# Patient Record
Sex: Female | Born: 1965 | Race: Black or African American | Hispanic: No | Marital: Married | Smoking: Former smoker
Health system: Southern US, Community
[De-identification: ages and names within clinical notes are randomized; demographics above are authoritative.]

## PROBLEM LIST (undated history)

## (undated) DIAGNOSIS — G4730 Sleep apnea, unspecified: Secondary | ICD-10-CM

## (undated) DIAGNOSIS — I10 Essential (primary) hypertension: Secondary | ICD-10-CM

---

## 1991-10-03 HISTORY — PX: TUBAL LIGATION: SHX77

## 1996-10-02 HISTORY — PX: ABDOMINAL HYSTERECTOMY: SHX81

## 2003-10-03 HISTORY — PX: BUNIONECTOMY: SHX129

## 2004-11-25 ENCOUNTER — Ambulatory Visit: Payer: Self-pay | Admitting: Podiatry

## 2005-01-20 ENCOUNTER — Ambulatory Visit: Payer: Self-pay | Admitting: Podiatry

## 2005-02-14 ENCOUNTER — Ambulatory Visit: Payer: Self-pay

## 2005-02-22 ENCOUNTER — Ambulatory Visit: Payer: Self-pay | Admitting: Podiatry

## 2008-10-19 DIAGNOSIS — G4730 Sleep apnea, unspecified: Secondary | ICD-10-CM | POA: Insufficient documentation

## 2008-10-19 DIAGNOSIS — Z720 Tobacco use: Secondary | ICD-10-CM | POA: Insufficient documentation

## 2008-10-20 ENCOUNTER — Ambulatory Visit: Payer: Self-pay

## 2008-10-23 LAB — HM PAP SMEAR

## 2009-02-27 DIAGNOSIS — L679 Hair color and hair shaft abnormality, unspecified: Secondary | ICD-10-CM | POA: Insufficient documentation

## 2010-03-29 DIAGNOSIS — Z833 Family history of diabetes mellitus: Secondary | ICD-10-CM | POA: Insufficient documentation

## 2010-03-29 DIAGNOSIS — R002 Palpitations: Secondary | ICD-10-CM | POA: Insufficient documentation

## 2010-10-08 ENCOUNTER — Emergency Department: Payer: Self-pay | Admitting: Emergency Medicine

## 2011-06-09 ENCOUNTER — Ambulatory Visit: Payer: Self-pay | Admitting: Family Medicine

## 2012-10-10 ENCOUNTER — Ambulatory Visit: Payer: Self-pay | Admitting: Family Medicine

## 2012-10-13 ENCOUNTER — Emergency Department: Payer: Self-pay | Admitting: Emergency Medicine

## 2012-10-13 LAB — RAPID INFLUENZA A&B ANTIGENS

## 2012-10-14 ENCOUNTER — Ambulatory Visit: Payer: Self-pay | Admitting: Family Medicine

## 2012-11-08 ENCOUNTER — Ambulatory Visit: Payer: Self-pay | Admitting: Family Medicine

## 2013-11-10 ENCOUNTER — Ambulatory Visit: Payer: Self-pay | Admitting: Family Medicine

## 2013-11-12 ENCOUNTER — Ambulatory Visit: Payer: Self-pay

## 2013-11-21 ENCOUNTER — Ambulatory Visit: Payer: Self-pay

## 2014-11-14 IMAGING — MG MM ADDITIONAL VIEWS AT NO CHARGE
2 series · 5 of 5 positions shown · non-contrast
Comparison: Priors dating back to 01/14/2004.

CLINICAL DATA: Patient recalled from screening for possible left
breast mass.

EXAM:
DIGITAL DIAGNOSTIC  LEFT MAMMOGRAM WITH CAD
ULTRASOUND LEFT BREAST

[L CC · left · 2 of 2 slices shown]
[im 1/2]
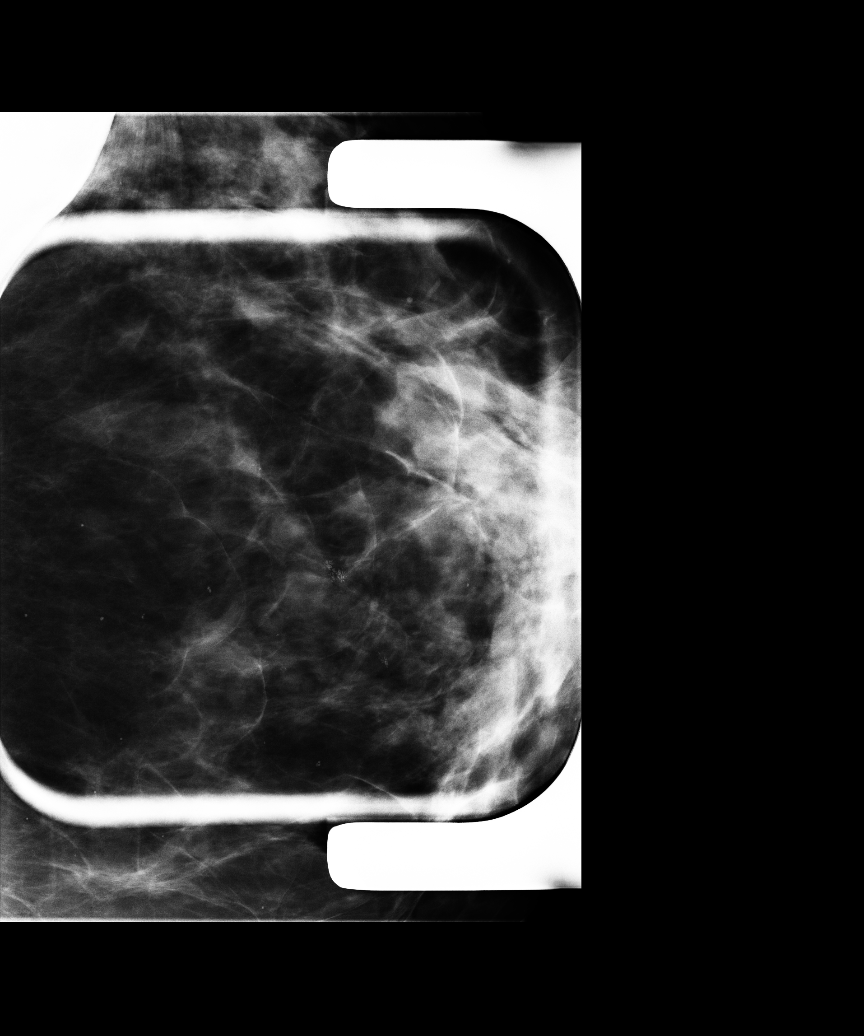
[im 2/2]
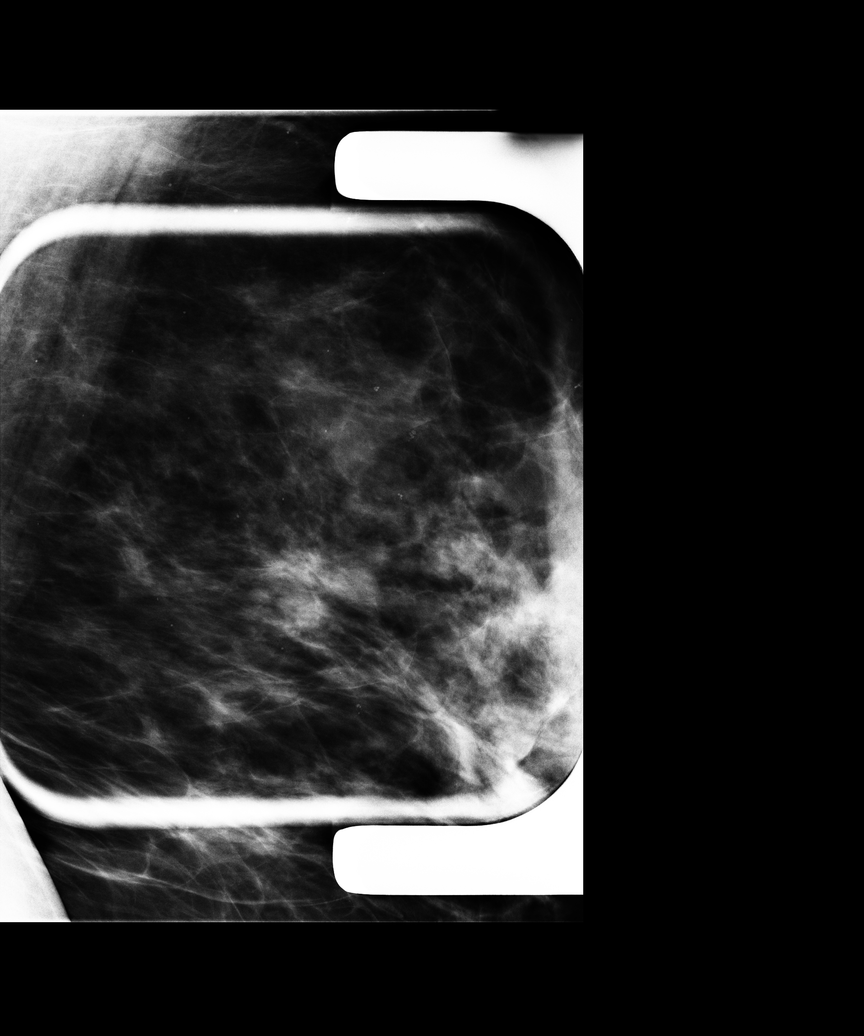

[L MLO · left · 3 of 3 slices shown]
[im 1/3]
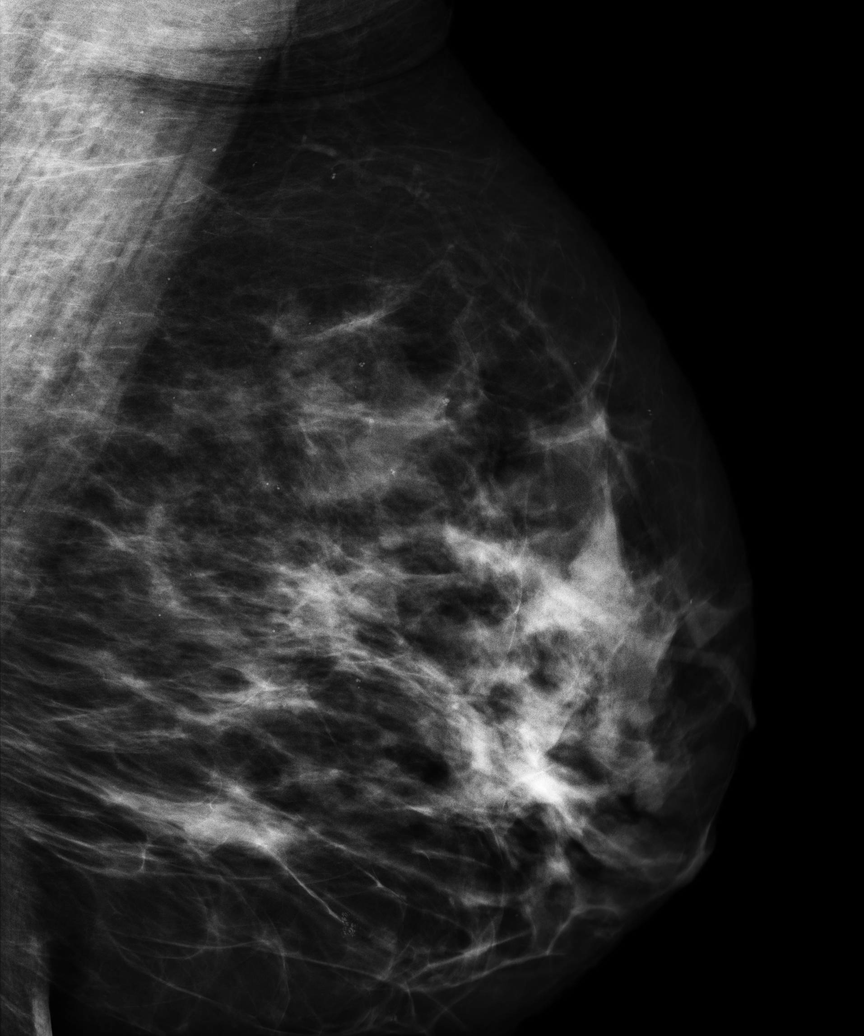
[im 2/3]
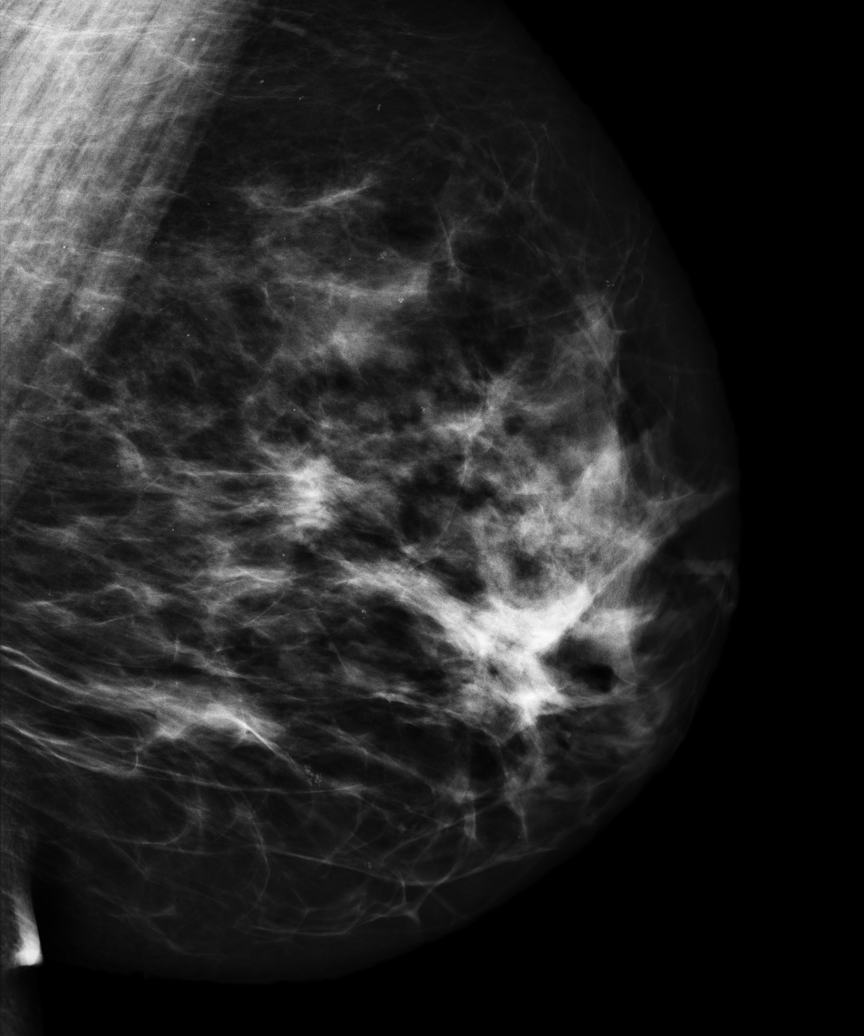
[im 3/3]
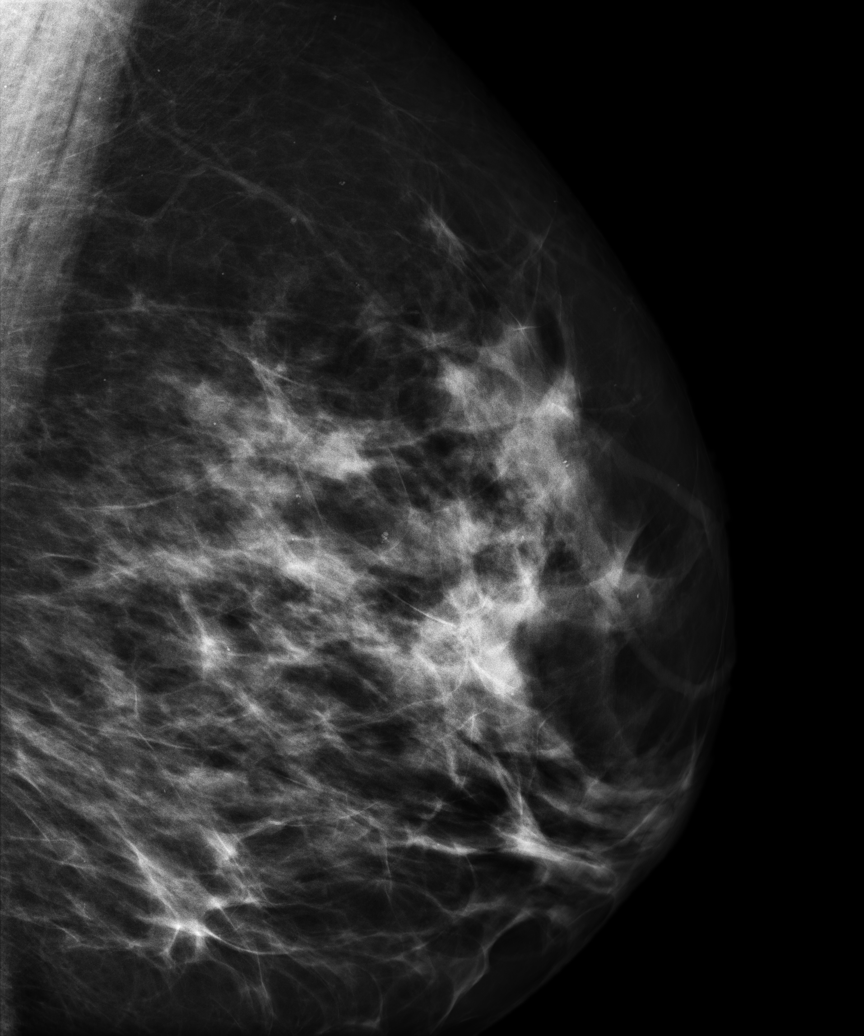

[5 of 5 positions shown; findings below may reference images not displayed]

ACR Breast Density Category c: The breast tissue is heterogeneously
dense, which may obscure small masses.
FINDINGS: Questioned mass within the 12 o'clock position left breast posterior
depth appears to resolve with additional imaging including spot
compression CC and MLO views as well as repeat full paddle MLO and
true lateral views of the left breast. The fibroglandular breast
pattern appears stable dating back to 10/20/2008.

Mammographic images were processed with CAD.

On physical exam, I palpate no discrete mass within the 12 o'clock
position left breast

Ultrasound is performed, showing no concerning mass within the 12
o'clock position left breast
IMPRESSION: No mammographic evidence for malignancy

RECOMMENDATION:
Screening mammogram in one year.(Code:[id number])

I have discussed the findings and recommendations with the patient.
Results were also provided in writing at the conclusion of the
visit. If applicable, a reminder letter will be sent to the patient
regarding the next appointment.

BI-RADS CATEGORY  1: Negative.

## 2015-04-17 ENCOUNTER — Other Ambulatory Visit: Payer: Self-pay | Admitting: Family Medicine

## 2015-04-27 ENCOUNTER — Ambulatory Visit
Admission: RE | Admit: 2015-04-27 | Discharge: 2015-04-27 | Disposition: A | Discharge status: Home / Self Care | Payer: BLUE CROSS/BLUE SHIELD | Source: Ambulatory Visit | Attending: Family Medicine | Admitting: Family Medicine

## 2015-04-27 ENCOUNTER — Other Ambulatory Visit: Payer: Self-pay | Admitting: Family Medicine

## 2015-04-27 DIAGNOSIS — Z1231 Encounter for screening mammogram for malignant neoplasm of breast: Secondary | ICD-10-CM

## 2015-04-28 ENCOUNTER — Telehealth: Payer: Self-pay

## 2015-04-28 NOTE — Telephone Encounter (Signed)
Patient advised as directed below. 

## 2015-04-28 NOTE — Telephone Encounter (Signed)
-----   Message from Margo Common, [place] sent at 04/27/2015  5:10 PM EDT ----- Normal mammograms. No sign of mass or cancer. Recheck test in a year.

## 2015-05-11 DIAGNOSIS — G4733 Obstructive sleep apnea (adult) (pediatric): Secondary | ICD-10-CM | POA: Insufficient documentation

## 2015-05-11 DIAGNOSIS — K1230 Oral mucositis (ulcerative), unspecified: Secondary | ICD-10-CM | POA: Insufficient documentation

## 2015-05-11 DIAGNOSIS — K5900 Constipation, unspecified: Secondary | ICD-10-CM | POA: Insufficient documentation

## 2015-05-11 DIAGNOSIS — M545 Low back pain, unspecified: Secondary | ICD-10-CM | POA: Insufficient documentation

## 2015-05-14 ENCOUNTER — Encounter: Payer: Self-pay | Admitting: Family Medicine

## 2015-05-14 ENCOUNTER — Ambulatory Visit (INDEPENDENT_AMBULATORY_CARE_PROVIDER_SITE_OTHER): Payer: BLUE CROSS/BLUE SHIELD | Admitting: Family Medicine

## 2015-05-14 VITALS — BP 138/66 | HR 78 | Temp 98.2°F | Resp 18 | Wt 253.0 lb

## 2015-05-14 DIAGNOSIS — M79672 Pain in left foot: Secondary | ICD-10-CM | POA: Diagnosis not present

## 2015-05-14 DIAGNOSIS — M25561 Pain in right knee: Secondary | ICD-10-CM | POA: Diagnosis not present

## 2015-05-14 DIAGNOSIS — J0111 Acute recurrent frontal sinusitis: Secondary | ICD-10-CM

## 2015-05-14 MED ORDER — AMOXICILLIN 875 MG PO TABS: 875.0000 mg | ORAL_TABLET | Freq: Two times a day (BID) | ORAL | Status: DC

## 2015-05-14 NOTE — Progress Notes (Signed)
Patient: Rhonda Williamson Female    DOB: 06-21-1966   49 y.o.   MRN: [id number] Visit Date: 05/14/2015  Today's Provider: Vernie Murders, PA   Chief Complaint  Patient presents with  . Knee Pain    Right  . Foot Pain    Left  . Sinusitis   Subjective:    Knee Pain  The incident occurred more than 1 week ago. There was no injury mechanism. The pain is present in the right knee. The quality of the pain is described as aching and burning. The pain is at a severity of 5/10 ((couple a days ago it was a 10 per pt)). The pain is moderate. The pain has been constant (When it starts) since onset. Pertinent negatives include no numbness or tingling. It is unknown if a foreign body is present. The symptoms are aggravated by movement and weight bearing. Treatments tried: Aleve; tried cold compress. The treatment provided mild relief.  Sinusitis This is a recurrent problem. The current episode started in the past 7 days. The problem is unchanged. There has been no fever (Had one on Sunday night ). The fever has been present for 1 to 2 days. Her pain is at a severity of 8/10 (when it starts draining). The pain is severe. Associated symptoms include congestion, coughing (every now and then), shortness of breath, sinus pressure, sneezing and a sore throat (because of the drainage in the back of the throat). Pertinent negatives include no ear pain or hoarse voice. Past treatments include spray decongestants. The treatment provided no relief.   Foot Pain:  Patient has Left Foot Pain on off, has tried Aleve for pain relief. Pain Location is side of the bottom of left foot. Pain quality is aching, burning, shooting,and stabbing. Pain severity is moderate to severe.  Pain numeric is about 4-5 right now, but when it flares is a 10. Pain course is is constant but when is aggravated the pain is worsening. Aggravated by movement and standing.Treatment tried massage and elevation. Associated with foot surgery  for neuromas/cysts 10-15 years ago.    Past Surgical History  Procedure Laterality Date  . Bunionectomy  2005  . Abdominal hysterectomy  1998  . Cesarean section  1993  . Tubal ligation  1993   History reviewed. No pertinent past medical history. Patient Active Problem List   Diagnosis Date Noted  . CN (constipation) 05/11/2015  . LBP (low back pain) 05/11/2015  . Obstructive apnea 05/11/2015  . Mucositis oral 05/11/2015  . Family history of diabetes mellitus 03/29/2010  . Awareness of heartbeats 03/29/2010  . 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 Disease of hair and hair follicles 24/23/5361  . Tobacco use 10/19/2008  . Apnea, sleep 10/19/2008  . Essential (primary) hypertension 12/28/2003   Family History  Problem Relation Age of Onset  . Ovarian cancer Mother   . Lung cancer Father   . Lung cancer Sister   . Bone cancer Sister   . CVA Sister   . Emphysema Brother   . Hypertension Sister     No Known Allergies   Previous Medications   AMLODIPINE (NORVASC) 10 MG TABLET    Take 1 tablet by mouth daily.   BENAZEPRIL (LOTENSIN) 20 MG TABLET    Take 1 tablet by mouth daily.   DICLOFENAC SODIUM (VOLTAREN) 1 % GEL    Place 2 g onto the skin. 3-4 TIMES DAILY PRN   FLUTICASONE (FLONASE) 50 MCG/ACT NASAL SPRAY  2 (TWO) SPRAYS IN EACH NOSTRIL AT BEDTIME   IBUPROFEN (ADVIL,MOTRIN) 800 MG TABLET    Take 1 tablet by mouth as needed.   KETOCONAZOLE (NIZORAL) 2 % SHAMPOO        Review of Systems  Constitutional: Negative.  Negative for fever.  HENT: Positive for congestion, nosebleeds, postnasal drip, rhinorrhea (a little), sinus pressure, sneezing and sore throat (because of the drainage in the back of the throat). Negative for ear pain and hoarse voice.   Eyes: Positive for itching.  Respiratory: Positive for cough (every now and then) and shortness of breath.   Cardiovascular: Negative.  Negative for chest pain and palpitations.  Gastrointestinal: Positive for constipation (a little).   Endocrine: Negative.   Genitourinary: Negative.   Musculoskeletal: Negative.   Skin: Negative.   Allergic/Immunologic: Negative.   Neurological: Negative.  Negative for tingling and numbness.  Hematological: Negative.   Psychiatric/Behavioral: Negative.     Social History  Substance Use Topics  . Smoking status: Former Research scientist (life sciences)  . Smokeless tobacco: Not on file     Comment: QUIT IN 2004  . Alcohol Use: No   Objective:   BP 138/66 mmHg  Pulse 78  Temp(Src) 98.2 F (36.8 C) (Oral)  Resp 18  Wt 253 lb (114.76 kg)  Physical Exam  Constitutional: She is oriented to person, place, and time. She appears well-developed and well-nourished.  HENT:  Head: Normocephalic and atraumatic.  Right Ear: External ear normal.  Left Ear: External ear normal.  Tender and poor transillumination of maxillary sinuses. Red turbinates and swollen.  Eyes: EOM are normal. Pupils are equal, round, and reactive to light.  Neck: Normal range of motion. Neck supple.  Cardiovascular: Normal rate, regular rhythm and normal heart sounds.   Pulmonary/Chest: Breath sounds normal.  Abdominal: Soft. Bowel sounds are normal.  Musculoskeletal: She exhibits no tenderness.  Increase pain in right knee to flex completely. No swelling or crepitus felt. Soreness along the lateral side of the left foot. No significant swelling.  Neurological: She is alert and oriented to person, place, and time.      Assessment & Plan:     1. Right knee pain Intermittent ache and pains in the right medial knee. Remembers a pop in the knee while she was jumping rope a couple months ago. Felt better for a while but when it popped again, discomfort continue to return occasionally. Will use Tylenol Arthritis and WD-40 spray (found it helps a lot with an ice pack when it flares). May need orthopedic referral depending or x-ray report. Will get x-rays done on Monday 05-17-15. - DG Knee Complete 4 Views Right; Future  2. Left foot  pain Having pains along the lateral side of the left foot intermittently. Has a history of surgery for "cysts" or Morton's neuromas. Will get x-ray evaluation. May need podiatry recheck. - DG Foot Complete Left; Future  3. Recurrent frontal sinusitis, unspecified chronicity Recent flare of allergy symptoms that progressed to sinus pressure and congestion with headache. Will treat with antihistamine and may use antihistamine with Mucinex and Tylenol. Recheck if no better in 5-7 days or fever develops. - amoxicillin (AMOXIL) 875 MG tablet; Take 1 tablet (875 mg total) by mouth 2 (two) times daily.  Dispense: 20 tablet; Refill: 0       Vernie Murders, PA  [facility]

## 2015-05-17 ENCOUNTER — Ambulatory Visit
Admission: RE | Admit: 2015-05-17 | Discharge: 2015-05-17 | Disposition: A | Discharge status: Home / Self Care | Payer: BLUE CROSS/BLUE SHIELD | Source: Ambulatory Visit | Attending: Family Medicine | Admitting: Family Medicine

## 2015-05-17 DIAGNOSIS — M79672 Pain in left foot: Secondary | ICD-10-CM | POA: Diagnosis not present

## 2015-05-17 DIAGNOSIS — M25561 Pain in right knee: Secondary | ICD-10-CM

## 2015-05-18 ENCOUNTER — Telehealth: Payer: Self-pay

## 2015-05-18 NOTE — Telephone Encounter (Signed)
-----   Message from Margo Common, [place] sent at 05/17/2015  6:48 PM EDT ----- Knee x-ray was negative for any bony abnormality. X-ray of the foot showed degenerative arthritis changes. Continue Tylenol and WD-40 for discomfort. If no better in 7-10 days, should consider podiatry referral.

## 2015-05-18 NOTE — Telephone Encounter (Signed)
Patient advised as directed below.  Thanks,  -Grason Brailsford 

## 2015-08-25 ENCOUNTER — Other Ambulatory Visit: Payer: Self-pay | Admitting: Family Medicine

## 2015-08-25 NOTE — Telephone Encounter (Signed)
Pt called saying she only has 2 or 3 left of her pills.  Can some one ok this refill.  benazepril (LOTENSIN) 20 MG tablet amLODipine (NORVASC) 10 MG t  [facility] [address]  Pt's call back Call [phone]  Thanks Con Memos

## 2015-09-28 ENCOUNTER — Other Ambulatory Visit: Payer: Self-pay | Admitting: Family Medicine

## 2015-09-28 DIAGNOSIS — I10 Essential (primary) hypertension: Secondary | ICD-10-CM

## 2015-09-30 NOTE — Telephone Encounter (Signed)
Patient took her last pills today and needs them please!  [facility] on [address]   The pharmacy told her they faxed [place] 3 times for this.

## 2015-09-30 NOTE — Telephone Encounter (Signed)
Rhonda Williamson patient 

## 2016-05-30 ENCOUNTER — Other Ambulatory Visit: Payer: Self-pay | Admitting: Family Medicine

## 2016-06-19 ENCOUNTER — Other Ambulatory Visit: Payer: Self-pay

## 2016-06-19 ENCOUNTER — Ambulatory Visit: Payer: Self-pay | Attending: Oncology

## 2016-06-19 ENCOUNTER — Ambulatory Visit
Admission: RE | Admit: 2016-06-19 | Discharge: 2016-06-19 | Disposition: A | Discharge status: Home / Self Care | Payer: Self-pay | Source: Ambulatory Visit | Attending: Oncology | Admitting: Oncology

## 2016-06-19 ENCOUNTER — Encounter (INDEPENDENT_AMBULATORY_CARE_PROVIDER_SITE_OTHER): Payer: Self-pay

## 2016-06-19 VITALS — BP 151/86 | HR 69 | Temp 98.2°F | Ht 69.29 in | Wt 249.6 lb

## 2016-06-19 DIAGNOSIS — Z0000 Encounter for general adult medical examination without abnormal findings: Secondary | ICD-10-CM

## 2016-06-19 DIAGNOSIS — N63 Unspecified lump in unspecified breast: Secondary | ICD-10-CM

## 2016-06-19 NOTE — Progress Notes (Signed)
Subjective:     Patient ID: Rhonda Williamson, female   DOB: 11/15/1965, 50 y.o.   MRN: [id number]  HPI   Review of Systems     Objective:   Physical Exam  Pulmonary/Chest: Right breast exhibits no inverted nipple, no mass, no nipple discharge, no skin change and no tenderness. Left breast exhibits no inverted nipple, no mass, no nipple discharge, no skin change and no tenderness. Breasts are symmetrical.  Large pendulous breasts.       Assessment:    50 year old female patient presents for [facility] clinic visit.  Patient screened, and meets BCCCP eligibility.  Patient does not have insurance, Medicare or Medicaid.  Handout given on Affordable Care Act.  Instructed patient on breast self-exam using teach back method.  CBE unremarkable.  Patient has recently adopted two boys of high school age.  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    Plan:     Sent for bilateral screening mammogram.

## 2016-06-29 ENCOUNTER — Ambulatory Visit: Payer: Self-pay

## 2016-07-03 ENCOUNTER — Ambulatory Visit
Admission: RE | Admit: 2016-07-03 | Discharge: 2016-07-03 | Disposition: A | Discharge status: Home / Self Care | Payer: Self-pay | Source: Ambulatory Visit | Attending: Oncology | Admitting: Oncology

## 2016-07-03 DIAGNOSIS — N63 Unspecified lump in unspecified breast: Secondary | ICD-10-CM

## 2016-07-03 DIAGNOSIS — N6311 Unspecified lump in the right breast, upper outer quadrant: Secondary | ICD-10-CM | POA: Insufficient documentation

## 2016-07-13 ENCOUNTER — Other Ambulatory Visit: Payer: Self-pay

## 2016-07-13 DIAGNOSIS — N63 Unspecified lump in unspecified breast: Secondary | ICD-10-CM

## 2016-07-13 NOTE — Progress Notes (Signed)
Patient scheduled for six month follow-up mammogram 01/02/17 at 11:00 at the [facility].  Mailed notification. Copy to [facility].

## 2016-08-25 ENCOUNTER — Ambulatory Visit (INDEPENDENT_AMBULATORY_CARE_PROVIDER_SITE_OTHER): Payer: Self-pay | Admitting: Family Medicine

## 2016-08-25 ENCOUNTER — Encounter: Payer: Self-pay | Admitting: Family Medicine

## 2016-08-25 VITALS — BP 138/82 | HR 72 | Temp 98.7°F | Resp 16 | Ht 69.0 in | Wt 255.0 lb

## 2016-08-25 DIAGNOSIS — Z114 Encounter for screening for human immunodeficiency virus [HIV]: Secondary | ICD-10-CM

## 2016-08-25 DIAGNOSIS — Z0000 Encounter for general adult medical examination without abnormal findings: Secondary | ICD-10-CM

## 2016-08-25 DIAGNOSIS — Z1211 Encounter for screening for malignant neoplasm of colon: Secondary | ICD-10-CM

## 2016-08-25 DIAGNOSIS — G4733 Obstructive sleep apnea (adult) (pediatric): Secondary | ICD-10-CM

## 2016-08-25 DIAGNOSIS — I10 Essential (primary) hypertension: Secondary | ICD-10-CM

## 2016-08-25 DIAGNOSIS — J0140 Acute pansinusitis, unspecified: Secondary | ICD-10-CM

## 2016-08-25 MED ORDER — FLUTICASONE PROPIONATE 50 MCG/ACT NA SUSP: NASAL | 2 refills | Status: DC

## 2016-08-25 MED ORDER — AMOXICILLIN 875 MG PO TABS: 875.0000 mg | ORAL_TABLET | Freq: Two times a day (BID) | ORAL | 0 refills | Status: DC

## 2016-08-25 MED ORDER — FLUCONAZOLE 150 MG PO TABS: 150.0000 mg | ORAL_TABLET | Freq: Once | ORAL | 0 refills | Status: DC

## 2016-08-25 NOTE — Patient Instructions (Signed)

## 2016-08-25 NOTE — Progress Notes (Signed)
Patient: Rhonda Williamson, Female    DOB: Jan 31, 1966, 49 y.o.   MRN: [id number] Visit Date: 08/25/2016  Today's Provider: Vernie Murders, PA   Chief Complaint  Patient presents with  . Annual Exam  . Sinusitis   Subjective:    Annual physical exam Rhonda Williamson is a 50 y.o. female who presents today for health maintenance and complete physical. She feels poorly.Due to possible sinus infection and body aches. She reports she is not exercising. She reports she is sleeping well. ----------------------------------------------------------------- Pap- 10/19/08 normal- Pt has had hysterectomy Mammogram- 06/19/16- additional views, probably benign repeat 6 months Colonoscopy- never.  Immunization History  Administered Date(s) Administered  . Pneumococcal Polysaccharide-23 11/08/2012  . Tdap 11/10/2013     Review of Systems  Constitutional: Negative.   HENT: Positive for sinus pressure, sore throat and tinnitus.   Eyes: Positive for discharge and redness.  Respiratory: Negative.   Cardiovascular: Negative.   Gastrointestinal: Negative.   Endocrine: Negative.   Genitourinary: Negative.   Musculoskeletal: Positive for myalgias.  Skin: Negative.   Allergic/Immunologic: Negative.   Neurological: Negative.   Hematological: Negative.   Psychiatric/Behavioral: Negative.     Social History      She  reports that she has quit smoking. She has never used smokeless tobacco. She reports that she does not drink alcohol or use drugs.       Social History   Social History  . Marital status: Married    Spouse name: N/A  . Number of children: N/A  . Years of education: N/A   Social History Main Topics  . Smoking status: Former Research scientist (life sciences)  . Smokeless tobacco: Never Used     Comment: QUIT IN 2004  . Alcohol use No  . Drug use: No  . Sexual activity: Not Asked   Other Topics Concern  . None   Social History Narrative  . None    History reviewed. No pertinent past  medical history.   Patient Active Problem List   Diagnosis Date Noted  . CN (constipation) 05/11/2015  . LBP (low back pain) 05/11/2015  . Obstructive apnea 05/11/2015  . Mucositis oral 05/11/2015  . Family history of diabetes mellitus 03/29/2010  . Awareness of heartbeats 03/29/2010  . 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 Disease of hair and hair follicles [id number]  . Tobacco use 10/19/2008  . Apnea, sleep 10/19/2008  . Essential (primary) hypertension 12/28/2003    Past Surgical History:  Procedure Laterality Date  . ABDOMINAL HYSTERECTOMY  1998  . BUNIONECTOMY  2005  . CESAREAN SECTION  1993  . TUBAL LIGATION  1993    Family History        Family Status  Relation Status  . Mother Deceased at age 75  . Father Deceased at age 53  . Sister Deceased  . Brother Alive  . Sister Deceased at age 58   died of cancer on 05/01/23  . Brother Deceased   ACCIDENTAL DEATH  . Brother Deceased  . Sister Alive   BREAST CYST  . Sister Alive        Her family history includes Bone cancer in her sister; CVA in her sister; Emphysema in her brother; Hypertension in her sister; Lung cancer in her father and sister; Ovarian cancer in her mother.     No Known Allergies   Current Outpatient Prescriptions:  .  amLODipine (NORVASC) 10 MG tablet, TAKE 1 TABLET BY MOUTH EVERY DAY, Disp: 90 tablet, Rfl: 3 .  benazepril (LOTENSIN) 20 MG tablet, TAKE ONE TABLET BY MOUTH EVERY DAY, Disp: 90 tablet, Rfl: 3 .  diclofenac sodium (VOLTAREN) 1 % GEL, Place 2 g onto the skin. 3-4 TIMES DAILY PRN, Disp: , Rfl:  .  ibuprofen (ADVIL,MOTRIN) 800 MG tablet, Take 1 tablet by mouth as needed., Disp: , Rfl:  .  ketoconazole (NIZORAL) 2 % shampoo, , Disp: , Rfl: 0 .  fluticasone (FLONASE) 50 MCG/ACT nasal spray, 2 (TWO) SPRAYS IN EACH NOSTRIL AT BEDTIME (Patient not taking: Reported on 08/25/2016), Disp: 16 g, Rfl: 2   Patient Care Team: Margo Common, PA as PCP - General (Physician Assistant) Rico Junker, RN as Registered Nurse Theodore Demark, RN as Registered Nurse      Objective:   Vitals: BP 138/82 (BP Location: Right Arm, Patient Position: Sitting, Cuff Size: Large)   Pulse 72   Temp 98.7 F (37.1 C) (Oral)   Resp 16   Ht 5\' 9"  (1.753 m)   Wt 255 lb (115.7 kg)   BMI 37.66 kg/m   Wt Readings from Last 3 Encounters:  08/25/16 255 lb (115.7 kg)  06/19/16 249 lb 9 oz (113.2 kg)  05/14/15 253 lb (114.8 kg)    Physical Exam  Constitutional: She is oriented to person, place, and time. She appears well-developed and well-nourished.  HENT:  Head: Normocephalic and atraumatic.  Right Ear: External ear normal.  Left Ear: External ear normal.  Nose: Nose normal.  Mouth/Throat: Oropharynx is clear and moist.  Swollen turbinates and no transillumination of sinuses with slight tenderness throughout. Poor TM reflex with stopped up sensation and fluid behind both TM's.  Eyes: Conjunctivae and EOM are normal. Pupils are equal, round, and reactive to light. Right eye exhibits no discharge.  Neck: Normal range of motion. Neck supple. No tracheal deviation present. No thyromegaly present.  Cardiovascular: Normal rate, regular rhythm, normal heart sounds and intact distal pulses.   No murmur heard. Pulmonary/Chest: Effort normal and breath sounds normal. No respiratory distress. She has no wheezes. She has no rales. She exhibits no tenderness.  Abdominal: Soft. Bowel sounds are normal. She exhibits no distension and no mass. There is no tenderness. There is no rebound and no guarding.  Genitourinary:  Genitourinary Comments: Partial hysterectomy in 1998 due to DUB and fibroids.  Musculoskeletal: Normal range of motion. She exhibits no edema or tenderness.  Lymphadenopathy:    She has no cervical adenopathy.  Neurological: She is alert and oriented to person, place, and time. She has normal reflexes. No cranial nerve deficit. She exhibits normal muscle tone. Coordination normal.  Skin:  Skin is warm and dry. No rash noted. No erythema.  Psychiatric: She has a normal mood and affect. Her behavior is normal. Judgment and thought content normal.     Depression Screen PHQ 2/9 Scores 08/25/2016  PHQ - 2 Score 0      Assessment & Plan:     Routine Health Maintenance and Physical Exam  Exercise Activities and Dietary recommendations Goals    Recommend losing 50 lbs by lowering caloric intake and exercising 30-40 minutes 4 days a week.      Immunization History  Administered Date(s) Administered  . Pneumococcal Polysaccharide-23 11/08/2012  . Tdap 11/10/2013    Health Maintenance  Topic Date Due  . HIV Screening  01/18/1980  . PAP SMEAR  10/24/2011  . COLONOSCOPY  01/18/2015  . INFLUENZA VACCINE  05/02/2016  . MAMMOGRAM  06/19/2018  . TETANUS/TDAP  11/11/2023  Discussed health benefits of physical activity, and encouraged her to engage in regular exercise appropriate for her age and condition.    --------------------------------------------------------------------  1. Annual physical exam General health stable. Needs to lose weight and get back on an exercise program. Tdap up to date. Will get follow up of breast lump with needle biopsy at [facility] after the first of the year. Will get routine labs and follow up pending reports. - CBC with Differential/Platelet - Comprehensive metabolic panel - Lipid panel - TSH  2. Essential (primary) hypertension Tolerating Amlodipine 10 mg qd and Benazepril 20 mg qd without side effects. BP stable and well controlled. Recheck routine labs and follow up pending reports. - CBC with Differential/Platelet - Comprehensive metabolic panel - Lipid panel - TSH  3. Obstructive apnea Sleeping well. Using CPAP at 12-13 cm H2O pressure regularly.  4. Acute pansinusitis, recurrence not specified Onset over the past couple weeks. Initially had some fever sensation and purulent mucus. Recurrence the past week with ears  feeling stopped up and milky fluid behind TM's now. Will treat with antibiotic and restart Flonase. Recommend trying Netti-Pot irrigation and recheck if no better in 7-10 days. Given Diflucan 150 mg in case she gets yeast infection from antibiotic. - CBC with Differential/Platelet - fluticasone (FLONASE) 50 MCG/ACT nasal spray; 2 (TWO) SPRAYS IN EACH NOSTRIL AT BEDTIME  Dispense: 16 g; Refill: 2 - amoxicillin (AMOXIL) 875 MG tablet; Take 1 tablet (875 mg total) by mouth 2 (two) times daily.  Dispense: 20 tablet; Refill: 0  5. Colon cancer screening Hemoccult negative for occult blood. No family history of colon cancer. - Ambulatory referral to Gastroenterology  6. Screening for HIV (human immunodeficiency virus) - HIV antibody  Vernie Murders, PA  [facility]

## 2016-09-06 ENCOUNTER — Other Ambulatory Visit: Payer: Self-pay | Admitting: Family Medicine

## 2016-09-06 DIAGNOSIS — J0140 Acute pansinusitis, unspecified: Secondary | ICD-10-CM

## 2016-09-06 NOTE — Telephone Encounter (Signed)
Pt called saying she needs the yeast medication today.  Thank sTeri

## 2016-09-07 ENCOUNTER — Other Ambulatory Visit: Payer: Self-pay | Admitting: Physician Assistant

## 2016-09-07 DIAGNOSIS — I10 Essential (primary) hypertension: Secondary | ICD-10-CM

## 2016-09-18 ENCOUNTER — Telehealth: Payer: Self-pay

## 2016-09-18 NOTE — Telephone Encounter (Signed)
Patient returned a call regarding scheduling a colonoscopy

## 2016-09-18 NOTE — Telephone Encounter (Signed)
LVM for patient to call office to schedule Colonoscopy.

## 2016-09-19 NOTE — Telephone Encounter (Signed)
Colonoscopy scheduled 10/13/16.

## 2016-09-21 ENCOUNTER — Other Ambulatory Visit: Payer: Self-pay

## 2016-09-21 ENCOUNTER — Telehealth: Payer: Self-pay

## 2016-09-21 NOTE — Telephone Encounter (Signed)
Gastroenterology Pre-Procedure Review  Request Date:  Requesting Physician: Dr.   PATIENT REVIEW QUESTIONS: The patient responded to the following health history questions as indicated:    1. Are you having any GI issues? no 2. Do you have a personal history of Polyps? no 3. Do you have a family history of Colon Cancer or Polyps? no 4. Diabetes Mellitus? no 5. Joint replacements in the past 12 months?no 6. Major health problems in the past 3 months?no 7. Any artificial heart valves, MVP, or defibrillator?no    MEDICATIONS & ALLERGIES:    Patient reports the following regarding taking any anticoagulation/antiplatelet therapy:   Plavix, Coumadin, Eliquis, Xarelto, Lovenox, Pradaxa, Brilinta, or Effient? no Aspirin? no  Patient confirms/reports the following medications:  Current Outpatient Prescriptions  Medication Sig Dispense Refill  . amLODipine (NORVASC) 10 MG tablet TAKE 1 TABLET BY MOUTH EVERY DAY 90 tablet 3  . amoxicillin (AMOXIL) 875 MG tablet Take 1 tablet (875 mg total) by mouth 2 (two) times daily. 20 tablet 0  . benazepril (LOTENSIN) 20 MG tablet TAKE 1 TABLET BY MOUTH EVERY DAY 90 tablet 3  . diclofenac sodium (VOLTAREN) 1 % GEL Place 2 g onto the skin. 3-4 TIMES DAILY PRN    . fluconazole (DIFLUCAN) 150 MG tablet TAKE 1 TABLET(150 MG) BY MOUTH 1 TIME 1 tablet 0  . fluticasone (FLONASE) 50 MCG/ACT nasal spray 2 (TWO) SPRAYS IN EACH NOSTRIL AT BEDTIME 16 g 2  . ibuprofen (ADVIL,MOTRIN) 800 MG tablet Take 1 tablet by mouth as needed.    Marland Kitchen ketoconazole (NIZORAL) 2 % shampoo   0   No current facility-administered medications for this visit.     Patient confirms/reports the following allergies:  No Known Allergies  No orders of the defined types were placed in this encounter.   AUTHORIZATION INFORMATION Primary Insurance: 1D#: Group #:  Secondary Insurance: 1D#: Group #:  SCHEDULE INFORMATION: Date: 10/13/16 Time: Location: [place]

## 2016-10-11 ENCOUNTER — Telehealth: Payer: Self-pay | Admitting: Gastroenterology

## 2016-10-11 NOTE — Telephone Encounter (Signed)
Patient called to cancel her colonoscopy on 1/12 I called [facility]. Please call patient to reschedule. She did state she doesn't have insurance at this time

## 2016-10-13 ENCOUNTER — Encounter: Admission: RE | Payer: Self-pay | Source: Ambulatory Visit

## 2016-10-13 ENCOUNTER — Ambulatory Visit: Admission: RE | Admit: 2016-10-13 | Payer: Self-pay | Source: Ambulatory Visit | Admitting: Gastroenterology

## 2016-10-13 SURGERY — COLONOSCOPY WITH PROPOFOL
Anesthesia: General

## 2017-01-02 ENCOUNTER — Ambulatory Visit
Admission: RE | Admit: 2017-01-02 | Discharge: 2017-01-02 | Disposition: A | Discharge status: Home / Self Care | Payer: Self-pay | Source: Ambulatory Visit | Attending: Oncology | Admitting: Oncology

## 2017-01-02 DIAGNOSIS — N63 Unspecified lump in unspecified breast: Secondary | ICD-10-CM

## 2017-01-03 ENCOUNTER — Ambulatory Visit: Payer: Self-pay | Attending: Oncology

## 2017-01-03 NOTE — Progress Notes (Signed)
Phoned patient with normal mammogram results.  Annual mammogram Wednesday July 18, 2017 at 1:00 p.m.  Mailed appointment info to patient.

## 2017-07-11 ENCOUNTER — Encounter: Payer: Self-pay | Admitting: Family Medicine

## 2017-07-18 ENCOUNTER — Ambulatory Visit: Payer: Self-pay | Attending: Oncology

## 2017-07-18 ENCOUNTER — Ambulatory Visit
Admission: RE | Admit: 2017-07-18 | Discharge: 2017-07-18 | Disposition: A | Discharge status: Home / Self Care | Payer: Self-pay | Source: Ambulatory Visit | Attending: Oncology | Admitting: Oncology

## 2017-07-18 ENCOUNTER — Encounter (INDEPENDENT_AMBULATORY_CARE_PROVIDER_SITE_OTHER): Payer: Self-pay

## 2017-07-18 VITALS — BP 143/83 | HR 82 | Temp 98.7°F | Ht 69.0 in | Wt 260.0 lb

## 2017-07-18 DIAGNOSIS — Z0000 Encounter for general adult medical examination without abnormal findings: Secondary | ICD-10-CM

## 2017-07-18 NOTE — Progress Notes (Signed)
Subjective:     Patient ID: Rhonda Williamson, female   DOB: November 18, 1965, 51 y.o.   MRN: [id number]  HPI   Review of Systems     Objective:   Physical Exam  Pulmonary/Chest: Right breast exhibits no inverted nipple, no mass, no nipple discharge, no skin change and no tenderness. Left breast exhibits no inverted nipple, no mass, no nipple discharge, no skin change and no tenderness. Breasts are symmetrical.       Assessment:     51 year old patient returns for [facility] clinic visit.  Patient screened, and meets BCCCP eligibility.  Patient does not have insurance, Medicare or Medicaid.  Handout given on Affordable Care Act.  Instructed patient on breast self-exam using teach back method.  CBE unremarkable.  No mass or lump palpated.  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    Plan:     Sent for bilateral screening mammogram.

## 2017-07-19 NOTE — Progress Notes (Signed)
Letter mailed from [facility] to notify of normal mammogram results.  Patient to return in one year for annual screening.  Copy to [facility]. 

## 2017-09-12 ENCOUNTER — Other Ambulatory Visit: Payer: Self-pay | Admitting: Family Medicine

## 2017-09-12 DIAGNOSIS — I10 Essential (primary) hypertension: Secondary | ICD-10-CM

## 2017-12-17 ENCOUNTER — Other Ambulatory Visit: Payer: Self-pay | Admitting: Family Medicine

## 2017-12-17 DIAGNOSIS — I10 Essential (primary) hypertension: Secondary | ICD-10-CM

## 2018-03-11 ENCOUNTER — Other Ambulatory Visit: Payer: Self-pay | Admitting: Family Medicine

## 2018-03-11 DIAGNOSIS — I10 Essential (primary) hypertension: Secondary | ICD-10-CM

## 2018-03-14 ENCOUNTER — Ambulatory Visit (INDEPENDENT_AMBULATORY_CARE_PROVIDER_SITE_OTHER): Payer: Self-pay | Admitting: Family Medicine

## 2018-03-14 ENCOUNTER — Encounter: Payer: Self-pay | Admitting: Family Medicine

## 2018-03-14 VITALS — BP 148/92 | HR 91 | Temp 99.1°F | Ht 69.0 in | Wt 262.4 lb

## 2018-03-14 DIAGNOSIS — W57XXXA Bitten or stung by nonvenomous insect and other nonvenomous arthropods, initial encounter: Secondary | ICD-10-CM

## 2018-03-14 DIAGNOSIS — I10 Essential (primary) hypertension: Secondary | ICD-10-CM

## 2018-03-14 DIAGNOSIS — G4733 Obstructive sleep apnea (adult) (pediatric): Secondary | ICD-10-CM

## 2018-03-14 DIAGNOSIS — S40861A Insect bite (nonvenomous) of right upper arm, initial encounter: Secondary | ICD-10-CM

## 2018-03-14 DIAGNOSIS — J0140 Acute pansinusitis, unspecified: Secondary | ICD-10-CM

## 2018-03-14 MED ORDER — KETOCONAZOLE 2 % EX SHAM: MEDICATED_SHAMPOO | Freq: Once | CUTANEOUS | 0 refills | Status: AC

## 2018-03-14 MED ORDER — BENAZEPRIL HCL 20 MG PO TABS: 20.0000 mg | ORAL_TABLET | Freq: Every day | ORAL | 0 refills | Status: DC

## 2018-03-14 MED ORDER — FLUTICASONE PROPIONATE 50 MCG/ACT NA SUSP: NASAL | 2 refills | Status: DC

## 2018-03-14 MED ORDER — FLUCONAZOLE 150 MG PO TABS: 150.0000 mg | ORAL_TABLET | Freq: Once | ORAL | 0 refills | Status: AC

## 2018-03-14 MED ORDER — AMLODIPINE BESYLATE 10 MG PO TABS: 10.0000 mg | ORAL_TABLET | Freq: Every day | ORAL | 0 refills | Status: DC

## 2018-03-14 MED ORDER — AMOXICILLIN 875 MG PO TABS: 875.0000 mg | ORAL_TABLET | Freq: Two times a day (BID) | ORAL | 0 refills | Status: DC

## 2018-03-14 NOTE — Progress Notes (Signed)
Patient: Rhonda Williamson Female    DOB: December 08, 1965   52 y.o.   MRN: [id number] Visit Date: 03/14/2018  Today's Provider: Vernie Murders, PA   Chief Complaint  Patient presents with  . Hypertension  . Follow-up   Subjective:    HPI  Hypertension, follow-up:  BP Readings from Last 3 Encounters:  03/14/18 (!) 148/92  07/18/17 (!) 143/83  08/25/16 138/82   She was last seen for hypertension more than 1 years ago.  BP at that visit was 138/82. Management changes since that visit include continue medications and get labs checked. She reports fair compliance with treatment. Patient did not have labs done. She is not having side effects.  She is exercising at times. She is adherent to low salt diet.   Outside blood pressures are not being checked. She is experiencing none.  Patient denies chest pain, chest pressure/discomfort, claudication, dyspnea, exertional chest pressure/discomfort, fatigue, irregular heart beat, lower extremity edema, near-syncope, orthopnea, palpitations, paroxysmal nocturnal dyspnea, syncope and tachypnea.   Cardiovascular risk factors include hypertension and obesity (BMI >= 30 kg/m2).   Weight trend: stable Wt Readings from Last 3 Encounters:  03/14/18 262 lb 6.4 oz (119 kg)  07/18/17 260 lb (117.9 kg)  08/25/16 255 lb (115.7 kg)   ------------------------------------------------------------------------ History reviewed. No pertinent past medical history. Patient Active Problem List   Diagnosis Date Noted  . CN (constipation) 05/11/2015  . LBP (low back pain) 05/11/2015  . Obstructive apnea 05/11/2015  . Mucositis oral 05/11/2015  . Family history of diabetes mellitus 03/29/2010  . Awareness of heartbeats 03/29/2010  . 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 Disease of hair and hair follicles 33/82/5053  . Tobacco use 10/19/2008  . Apnea, sleep 10/19/2008  . Essential (primary) hypertension 12/28/2003   Past Surgical History:  Procedure Laterality  Date  . ABDOMINAL HYSTERECTOMY  1998  . BUNIONECTOMY  2005  . CESAREAN SECTION  1993  . TUBAL LIGATION  1993   Family History  Problem Relation Age of Onset  . Ovarian cancer Mother   . Lung cancer Father   . Lung cancer Sister   . Bone cancer Sister   . CVA Sister   . Emphysema Brother   . Hypertension Sister   . Breast cancer Neg Hx     Allergies  Allergen Reactions  . Latex Itching    Current Outpatient Medications:  .  amLODipine (NORVASC) 10 MG tablet, TAKE 1 TABLET BY MOUTH EVERY DAY, Disp: 90 tablet, Rfl: 0 .  benazepril (LOTENSIN) 20 MG tablet, TAKE 1 TABLET BY MOUTH EVERY DAY, Disp: 90 tablet, Rfl: 0 .  diclofenac sodium (VOLTAREN) 1 % GEL, Place 2 g onto the skin. 3-4 TIMES DAILY PRN, Disp: , Rfl:  .  fluticasone (FLONASE) 50 MCG/ACT nasal spray, 2 (TWO) SPRAYS IN EACH NOSTRIL AT BEDTIME, Disp: 16 g, Rfl: 2 .  ibuprofen (ADVIL,MOTRIN) 800 MG tablet, Take 1 tablet by mouth as needed., Disp: , Rfl:  .  ketoconazole (NIZORAL) 2 % shampoo, , Disp: , Rfl: 0  Review of Systems  Constitutional: Negative.   HENT: Positive for ear pain, sinus pressure and sinus pain.   Respiratory: Negative.   Cardiovascular: Negative.   Skin:       Right upper arm swelling possible insect bite    Social History   Tobacco Use  . Smoking status: Former Research scientist (life sciences)  . Smokeless tobacco: Never Used  . Tobacco comment: QUIT IN 2004  Substance Use Topics  . Alcohol  use: No    Alcohol/week: 0.0 oz   Objective:   BP (!) 148/92 (BP Location: Right Arm, Patient Position: Sitting, Cuff Size: Large)   Pulse 91   Temp 99.1 F (37.3 C) (Oral)   Ht 5\' 9"  (1.753 m)   Wt 262 lb 6.4 oz (119 kg)   LMP 07/19/1995 (Approximate)   SpO2 97%   BMI 38.75 kg/m   Wt Readings from Last 3 Encounters:  03/14/18 262 lb 6.4 oz (119 kg)  07/18/17 260 lb (117.9 kg)  08/25/16 255 lb (115.7 kg)    Vitals:   03/14/18 1424  BP: (!) 148/92  Pulse: 91  Temp: 99.1 F (37.3 C)  TempSrc: Oral  SpO2: 97%    Weight: 262 lb 6.4 oz (119 kg)  Height: 5\' 9"  (1.753 m)   Physical Exam  Constitutional: She is oriented to person, place, and time. She appears well-developed and well-nourished. No distress.  HENT:  Head: Normocephalic and atraumatic.  Right Ear: Hearing and external ear normal.  Left Ear: Hearing and external ear normal.  Nose: Nose normal.  Mouth/Throat: No oropharyngeal exudate.  Tender maxillary and ethmoid sinuses with red turbinates. No transillumination throughout.  Eyes: Conjunctivae and lids are normal. Right eye exhibits no discharge. Left eye exhibits no discharge. No scleral icterus.  Neck: Neck supple.  Cardiovascular: Normal rate and regular rhythm.  Pulmonary/Chest: Effort normal and breath sounds normal. No respiratory distress.  Abdominal: Soft. Bowel sounds are normal.  Musculoskeletal: Normal range of motion.  Lymphadenopathy:    She has no cervical adenopathy.  Neurological: She is alert and oriented to person, place, and time.  Skin: Skin is intact. No lesion and no rash noted. There is erythema.  Swelling and itching right upper inner arm above elbow. No open ulcer or scabs.  Psychiatric: She has a normal mood and affect. Her speech is normal and behavior is normal. Thought content normal.      Assessment & Plan:     1. Acute pansinusitis, recurrence not specified Onset the past few days. Needs refill of Fluticasone for allergic rhinitis and recurrent sinusitis. Feeling malaise with no low grade fever. Check CBC and given Amoxil with Diflucan for infection and possible yeast infection after antibiotic taken. Recheck pending lab reports. - fluticasone (FLONASE) 50 MCG/ACT nasal spray; 2 (TWO) SPRAYS IN EACH NOSTRIL AT BEDTIME  Dispense: 16 g; Refill: 2 - CBC with Differential/Platelet - amoxicillin (AMOXIL) 875 MG tablet; Take 1 tablet (875 mg total) by mouth 2 (two) times daily.  Dispense: 20 tablet; Refill: 0 - fluconazole (DIFLUCAN) 150 MG tablet; Take 1  tablet (150 mg total) by mouth once for 1 dose.  Dispense: 1 tablet; Refill: 0  2. Essential hypertension BP slightly elevated today with sinus infection. Refill Amlodipine and Lotensin. Recheck labs and follow up pending reports.  - amLODipine (NORVASC) 10 MG tablet; Take 1 tablet (10 mg total) by mouth daily.  Dispense: 90 tablet; Refill: 0 - benazepril (LOTENSIN) 20 MG tablet; Take 1 tablet (20 mg total) by mouth daily.  Dispense: 90 tablet; Refill: 0 - Comprehensive metabolic panel - TSH  3. Obstructive apnea Sleeping well with continued use of the CPAP each night.  4. Insect bite of right upper arm, initial encounter Onset 2 days ago. No specific insect bite seen. Very pruritic and some swelling right upper inner arm without local lymphadenopathy. Treat with antihistamine (OTC). No breathing issues.       Vernie Murders, [place]  [place]

## 2018-06-10 ENCOUNTER — Other Ambulatory Visit: Payer: Self-pay | Admitting: Family Medicine

## 2018-06-10 DIAGNOSIS — I10 Essential (primary) hypertension: Secondary | ICD-10-CM

## 2018-06-13 ENCOUNTER — Telehealth: Payer: Self-pay | Admitting: Family Medicine

## 2018-06-13 ENCOUNTER — Other Ambulatory Visit: Payer: Self-pay | Admitting: Family Medicine

## 2018-06-13 DIAGNOSIS — I10 Essential (primary) hypertension: Secondary | ICD-10-CM

## 2018-06-13 MED ORDER — AMLODIPINE BESYLATE 10 MG PO TABS: 10.0000 mg | ORAL_TABLET | Freq: Every day | ORAL | 0 refills | Status: DC

## 2018-06-13 MED ORDER — BENAZEPRIL HCL 20 MG PO TABS: 20.0000 mg | ORAL_TABLET | Freq: Every day | ORAL | 0 refills | Status: DC

## 2018-06-13 NOTE — Telephone Encounter (Signed)
Pt contacted office for refill request on the following medications:  1. amLODipine (NORVASC) 10 MG tablet  2. benazepril (LOTENSIN) 20 MG tablet  [facility] [address]  Pt stated that her pharmacy advised the refill request was denied. Pt was advised that was correct because Simona Huh noted she needs an OV. Pt stated that she was just here on 03/14/18 and she doesn't feel she needs another OV that soon. Pt is requesting the Rx be sent in today because she is out of the medication. Pt was advised she didn't have the labs done in June either. Pt stated she didn't have the money at the time but she will be glad to get the labs done now but she a least needs a 30 day supply since she is completely out of the medication. Please advise. Thanks TNP

## 2018-06-13 NOTE — Telephone Encounter (Signed)
Reprint labs set up at the 03-14-18 visit that she did not get accomplished. Has not done any of the labs work ordered since 2017. These medications need the follow up labs to be sure they do not cause kidney, liver, electrolyte or blood count issues. Will refill once more for a month.

## 2018-06-13 NOTE — Telephone Encounter (Signed)
Pt called back wanting to know an answer to the earlier phone message  teri

## 2018-06-14 NOTE — Telephone Encounter (Signed)
Pt advised.  She states she is getting her lab work done today.  Thanks,   -Mickel Baas

## 2018-06-15 LAB — COMPREHENSIVE METABOLIC PANEL
ALT: 30 IU/L (ref 0–32)
AST: 23 IU/L (ref 0–40)
Albumin/Globulin Ratio: 1.5 (ref 1.2–2.2)
Albumin: 4.4 g/dL (ref 3.5–5.5)
Alkaline Phosphatase: 71 IU/L (ref 39–117)
BILIRUBIN TOTAL: 0.5 mg/dL (ref 0.0–1.2)
BUN / CREAT RATIO: 16 (ref 9–23)
BUN: 10 mg/dL (ref 6–24)
CALCIUM: 9.4 mg/dL (ref 8.7–10.2)
CHLORIDE: 105 mmol/L (ref 96–106)
CO2: 24 mmol/L (ref 20–29)
CREATININE: 0.61 mg/dL (ref 0.57–1.00)
GFR, EST AFRICAN AMERICAN: 120 mL/min/{1.73_m2} (ref >59)
GFR, EST NON AFRICAN AMERICAN: 104 mL/min/{1.73_m2} (ref >59)
GLUCOSE: 100 mg/dL — AB (ref 65–99)
Globulin, Total: 3 g/dL (ref 1.5–4.5)
Potassium: 4.1 mmol/L (ref 3.5–5.2)
Sodium: 143 mmol/L (ref 134–144)
TOTAL PROTEIN: 7.4 g/dL (ref 6.0–8.5)

## 2018-06-15 LAB — CBC WITH DIFFERENTIAL/PLATELET
BASOS ABS: 0 10*3/uL (ref 0.0–0.2)
Basos: 1 %
EOS (ABSOLUTE): 0.1 10*3/uL (ref 0.0–0.4)
Eos: 1 %
HEMOGLOBIN: 12.9 g/dL (ref 11.1–15.9)
Hematocrit: 38.2 % (ref 34.0–46.6)
IMMATURE GRANS (ABS): 0 10*3/uL (ref 0.0–0.1)
IMMATURE GRANULOCYTES: 0 %
LYMPHS: 56 %
Lymphocytes Absolute: 3.4 10*3/uL — ABNORMAL HIGH (ref 0.7–3.1)
MCH: 29.1 pg (ref 26.6–33.0)
MCHC: 33.8 g/dL (ref 31.5–35.7)
MCV: 86 fL (ref 79–97)
MONOCYTES: 4 %
Monocytes Absolute: 0.2 10*3/uL (ref 0.1–0.9)
NEUTROS ABS: 2.3 10*3/uL (ref 1.4–7.0)
NEUTROS PCT: 38 %
Platelets: 283 10*3/uL (ref 150–450)
RBC: 4.43 x10E6/uL (ref 3.77–5.28)
RDW: 13.1 % (ref 12.3–15.4)
WBC: 6.1 10*3/uL (ref 3.4–10.8)

## 2018-06-15 LAB — TSH: TSH: 1.26 u[IU]/mL (ref 0.450–4.500)

## 2018-06-17 ENCOUNTER — Telehealth: Payer: Self-pay

## 2018-06-17 NOTE — Telephone Encounter (Signed)
-----   Message from Margo Common, [place] sent at 06/16/2018  7:26 PM EDT ----- Blood tests borderline blood sugar. Remainder of tests normal without kidney or liver dysfunction. Should schedule follow up appointment to check Hgb A1C in the office to rule out diabetes.

## 2018-06-17 NOTE — Telephone Encounter (Signed)
Patient advised as below. Patient verbalizes understanding and is in agreement with treatment plan.  

## 2018-06-25 ENCOUNTER — Ambulatory Visit: Payer: Self-pay | Admitting: Family Medicine

## 2018-06-28 ENCOUNTER — Encounter: Payer: Self-pay | Admitting: Family Medicine

## 2018-06-28 ENCOUNTER — Ambulatory Visit: Payer: Self-pay | Admitting: Family Medicine

## 2018-06-28 VITALS — BP 136/68 | HR 86 | Temp 98.6°F | Wt 254.0 lb

## 2018-06-28 DIAGNOSIS — Z23 Encounter for immunization: Secondary | ICD-10-CM

## 2018-06-28 DIAGNOSIS — G4733 Obstructive sleep apnea (adult) (pediatric): Secondary | ICD-10-CM

## 2018-06-28 DIAGNOSIS — R7301 Impaired fasting glucose: Secondary | ICD-10-CM

## 2018-06-28 DIAGNOSIS — I10 Essential (primary) hypertension: Secondary | ICD-10-CM

## 2018-06-28 LAB — POCT GLYCOSYLATED HEMOGLOBIN (HGB A1C): Hemoglobin A1C: 6.2 % — AB (ref 4.0–5.6)

## 2018-06-28 MED ORDER — BENAZEPRIL HCL 20 MG PO TABS: ORAL_TABLET | ORAL | 3 refills | Status: DC

## 2018-06-28 MED ORDER — AMLODIPINE BESYLATE 10 MG PO TABS: ORAL_TABLET | ORAL | 3 refills | Status: DC

## 2018-06-28 NOTE — Progress Notes (Addendum)
Patient: Rhonda Williamson Female    DOB: [birth year]   52 y.o.   MRN: [id number] Visit Date: 06/28/2018  Today's Provider: Vernie Murders, PA   Chief Complaint  Patient presents with  . Hyperglycemia   Subjective:    Hyperglycemia  This is a new problem. Associated symptoms include diaphoresis. Pertinent negatives include no abdominal pain, chest pain, chills, fatigue, fever, headaches, numbness or urinary symptoms.   Patient Active Problem List   Diagnosis Date Noted  . CN (constipation) 05/11/2015  . LBP (low back pain) 05/11/2015  . Obstructive apnea 05/11/2015  . Mucositis oral 05/11/2015  . Family history of diabetes mellitus 03/29/2010  . Awareness of heartbeats 03/29/2010  . 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 Disease of hair and hair follicles 42/70/6237  . Tobacco use 10/19/2008  . Apnea, sleep 10/19/2008  . Essential (primary) hypertension 12/28/2003      Past Surgical History:  Procedure Laterality Date  . ABDOMINAL HYSTERECTOMY  1998  . BUNIONECTOMY  2005  . CESAREAN SECTION  1993  . TUBAL LIGATION  1993   Family History  Problem Relation Age of Onset  . Ovarian cancer Mother   . Lung cancer Father   . Lung cancer Sister   . Bone cancer Sister   . CVA Sister   . Emphysema Brother   . Hypertension Sister   . Breast cancer Neg Hx    Allergies  Allergen Reactions  . Latex Itching    Current Outpatient Medications:  .  amLODipine (NORVASC) 10 MG tablet, TAKE 1 TABLET(10 MG) BY MOUTH DAILY, Disp: 90 tablet, Rfl: 0 .  benazepril (LOTENSIN) 20 MG tablet, TAKE 1 TABLET(20 MG) BY MOUTH DAILY, Disp: 90 tablet, Rfl: 0 .  fluticasone (FLONASE) 50 MCG/ACT nasal spray, 2 (TWO) SPRAYS IN EACH NOSTRIL AT BEDTIME, Disp: 16 g, Rfl: 2 .  ibuprofen (ADVIL,MOTRIN) 800 MG tablet, Take 1 tablet by mouth as needed., Disp: , Rfl:  .  amoxicillin (AMOXIL) 875 MG tablet, Take 1 tablet (875 mg total) by mouth 2 (two) times daily., Disp: 20 tablet, Rfl: 0 .  diclofenac sodium  (VOLTAREN) 1 % GEL, Place 2 g onto the skin. 3-4 TIMES DAILY PRN, Disp: , Rfl:   Review of Systems  Constitutional: Positive for diaphoresis. Negative for activity change, appetite change, chills, fatigue, fever and unexpected weight change.  Respiratory: Negative.   Cardiovascular: Negative.  Negative for chest pain.  Gastrointestinal: Negative.  Negative for abdominal pain.  Endocrine: Negative for cold intolerance, heat intolerance, polydipsia, polyphagia and polyuria.  Musculoskeletal: Negative.   Neurological: Negative for dizziness, light-headedness, numbness and headaches.   Social History   Tobacco Use  . Smoking status: Former Research scientist (life sciences)  . Smokeless tobacco: Never Used  . Tobacco comment: QUIT IN 2004  Substance Use Topics  . Alcohol use: No    Alcohol/week: 0.0 standard drinks   Objective:   BP 136/68 (BP Location: Right Arm, Patient Position: Sitting, Cuff Size: Large)   Pulse 86   Temp 98.6 F (37 C) (Oral)   Wt 254 lb (115.2 kg)   LMP 07/19/1995 (Approximate)   SpO2 96%   BMI 37.51 kg/m   Wt Readings from Last 3 Encounters:  06/28/18 254 lb (115.2 kg)  03/14/18 262 lb 6.4 oz (119 kg)  07/18/17 260 lb (117.9 kg)   Vitals:   06/28/18 1000  BP: 136/68  Pulse: 86  Temp: 98.6 F (37 C)  TempSrc: Oral  SpO2: 96%  Weight: 254  lb (115.2 kg)   Results for orders placed or performed in visit on 06/28/18  POCT glycosylated hemoglobin (Hb A1C)  Result Value Ref Range   Hemoglobin A1C 6.2 (A) 4.0 - 5.6 %   Physical Exam  Constitutional: She is oriented to person, place, and time. She appears well-developed and well-nourished. No distress.  HENT:  Head: Normocephalic and atraumatic.  Right Ear: Hearing normal.  Left Ear: Hearing normal.  Nose: Nose normal.  Eyes: Conjunctivae and lids are normal. Right eye exhibits no discharge. Left eye exhibits no discharge. No scleral icterus.  Neck: Neck supple.  Cardiovascular: Normal rate.  Pulmonary/Chest: Effort normal  and breath sounds normal. No respiratory distress.  Abdominal: Soft. Bowel sounds are normal.  Musculoskeletal: Normal range of motion.  Neurological: She is alert and oriented to person, place, and time.  Skin: Skin is intact. No lesion and no rash noted.  Psychiatric: She has a normal mood and affect. Her speech is normal and behavior is normal. Thought content normal.      Assessment & Plan:     1. Elevated fasting glucose Having some sweating and CMP showed borderline fasting glucose at 100 on 06-14-18. Has been trying to control diet and lost 8 lbs since 03-14-18. Hgb A1C 6.2% today. This puts her in the prediabetic range. Recommend a balanced diabetic diet and should exercise 30-40 minutes 4-5 days a week. Recommend she schedule for CPE and colonoscopy in the next 3-4 months. - POCT glycosylated hemoglobin (Hb A1C)  2. Essential (primary) hypertension Well controlled BP on the Amlodipine 10 mg qd and Benazepril 20 mg qd. No side effects of cough or swelling/hives. Restrict salt and caffeine intake. Labs on 06-14-18 was all normal levels / no sign of liver or kidney disease. - amLODipine (NORVASC) 10 MG tablet; TAKE 1 TABLET(10 MG) BY MOUTH DAILY  Dispense: 90 tablet; Refill: 3 - benazepril (LOTENSIN) 20 MG tablet; TAKE 1 TABLET(20 MG) BY MOUTH DAILY  Dispense: 90 tablet; Refill: 3  3. Obstructive apnea Diagnosed with severe OSA in 2006. Still having severe daytime sleepiness and snoring. Needs to get back on the CPAP at 15 cm H2O by mask each night.  4. Need for influenza vaccination - Flu Vaccine QUAD 36+ mos IM       Vernie Murders, [facility]

## 2018-07-22 ENCOUNTER — Ambulatory Visit: Payer: Self-pay

## 2018-09-23 ENCOUNTER — Encounter (INDEPENDENT_AMBULATORY_CARE_PROVIDER_SITE_OTHER): Payer: Self-pay

## 2018-09-23 ENCOUNTER — Ambulatory Visit
Admission: RE | Admit: 2018-09-23 | Discharge: 2018-09-23 | Disposition: A | Discharge status: Home / Self Care | Payer: Self-pay | Source: Ambulatory Visit | Attending: Oncology | Admitting: Oncology

## 2018-09-23 ENCOUNTER — Ambulatory Visit: Payer: Self-pay | Attending: Oncology

## 2018-09-23 VITALS — BP 126/84 | HR 82 | Temp 98.4°F | Ht 69.0 in | Wt 245.0 lb

## 2018-09-23 DIAGNOSIS — Z0000 Encounter for general adult medical examination without abnormal findings: Secondary | ICD-10-CM | POA: Insufficient documentation

## 2018-09-23 NOTE — Progress Notes (Signed)
  Subjective:     Patient ID: Leda Quail, female   DOB: [birth year], 52 y.o.   MRN: [id number]  HPI   Review of Systems     Objective:   Physical Exam Chest:     Breasts:        Right: No swelling, bleeding, inverted nipple, mass, nipple discharge, skin change or tenderness.        Left: No swelling, bleeding, inverted nipple, mass, nipple discharge, skin change or tenderness.     Comments: Large pendulous breasts       Assessment:     52 year old patient returns for annual BCCCP screening.  Patient screened, and meets BCCCP eligibility.  Patient does not have insurance, Medicare or Medicaid.  Handout given on Affordable Care Act.  Instructed patient on breast self awareness using teach back method.  Clinical breast exam unremarkable.  No mass or lump palpated.  Patient had partial hysterectomy in her early 36's.  States she is still experiencing occasional hot flashes.  She is going to check with her pharmacist to see if she can try Vitiamin E 400-800 IU for hot flashes.  Risk Assessment    Risk Scores      09/23/2018   Last edited by: Theodore Demark, RN   5-year risk: 1.3 %   Lifetime risk: 8.2 %            Plan:     Sent for bilateral screening mammogram.

## 2018-10-09 NOTE — Progress Notes (Signed)
Letter mailed from [facility] to notify of normal mammogram results.  Patient to return in one year for annual screening.  Copy to [facility]. 

## 2019-07-29 ENCOUNTER — Other Ambulatory Visit: Payer: Self-pay | Admitting: *Deleted

## 2019-07-29 DIAGNOSIS — Z20822 Contact with and (suspected) exposure to covid-19: Secondary | ICD-10-CM

## 2019-07-31 LAB — NOVEL CORONAVIRUS, NAA: SARS-CoV-2, NAA: NOT DETECTED

## 2019-08-08 ENCOUNTER — Other Ambulatory Visit: Payer: Self-pay | Admitting: Family Medicine

## 2019-08-08 DIAGNOSIS — I10 Essential (primary) hypertension: Secondary | ICD-10-CM

## 2019-08-08 NOTE — Telephone Encounter (Signed)
Please review. Thanks!  

## 2019-08-08 NOTE — Telephone Encounter (Signed)
Pt called stating pharmacy was wending electronic refill request.  Pt made an appt for 11/27 so she could get medication refilled.

## 2019-08-08 NOTE — Telephone Encounter (Signed)
Patient calling office back again to inquire about refill request. Rhonda Williamson

## 2019-08-29 ENCOUNTER — Other Ambulatory Visit: Payer: Self-pay

## 2019-08-29 ENCOUNTER — Encounter: Payer: Self-pay | Admitting: Family Medicine

## 2019-08-29 ENCOUNTER — Ambulatory Visit (INDEPENDENT_AMBULATORY_CARE_PROVIDER_SITE_OTHER): Payer: Self-pay | Admitting: Family Medicine

## 2019-08-29 VITALS — BP 157/87 | HR 87 | Temp 97.7°F | Resp 16 | Ht 68.0 in | Wt 258.0 lb

## 2019-08-29 DIAGNOSIS — Z833 Family history of diabetes mellitus: Secondary | ICD-10-CM

## 2019-08-29 DIAGNOSIS — J4520 Mild intermittent asthma, uncomplicated: Secondary | ICD-10-CM

## 2019-08-29 DIAGNOSIS — I10 Essential (primary) hypertension: Secondary | ICD-10-CM

## 2019-08-29 DIAGNOSIS — T148XXA Other injury of unspecified body region, initial encounter: Secondary | ICD-10-CM

## 2019-08-29 DIAGNOSIS — G4733 Obstructive sleep apnea (adult) (pediatric): Secondary | ICD-10-CM

## 2019-08-29 DIAGNOSIS — J302 Other seasonal allergic rhinitis: Secondary | ICD-10-CM

## 2019-08-29 LAB — POCT GLYCOSYLATED HEMOGLOBIN (HGB A1C)
Est. average glucose Bld gHb Est-mCnc: 123
Hemoglobin A1C: 5.9 % — AB (ref 4.0–5.6)

## 2019-08-29 MED ORDER — FLUTICASONE PROPIONATE 50 MCG/ACT NA SUSP: NASAL | 2 refills | Status: DC

## 2019-08-29 MED ORDER — ALBUTEROL SULFATE HFA 108 (90 BASE) MCG/ACT IN AERS: 2.0000 | INHALATION_SPRAY | Freq: Four times a day (QID) | RESPIRATORY_TRACT | 3 refills | Status: DC | PRN

## 2019-08-29 NOTE — Progress Notes (Signed)
Patient: Rhonda Williamson Female    DOB: [birth year]   53 y.o.   MRN: [id number] Visit Date: 08/29/2019  Today's Provider: Vernie Murders, PA   Chief Complaint  Patient presents with  . Follow-up  . Hypertension  . Diabetes  . Back Pain   Subjective:     Back Pain This is a new problem. The current episode started 1 to 4 weeks ago. The problem occurs intermittently. The problem is unchanged. The quality of the pain is described as burning. The pain does not radiate. The pain is mild. The symptoms are aggravated by bending, position and twisting. She has tried ice and heat for the symptoms. The treatment provided mild relief.    Diabetes Mellitus Type II, Follow-up:   Lab Results  Component Value Date   HGBA1C 5.9 (A) 08/29/2019   HGBA1C 6.2 (A) 06/28/2018   Last seen for diabetes 1 years ago.  Management since then includes N/A. Current symptoms include none and have been stable.  Episodes of hypoglycemia? no    Most Recent Eye Exam: N/A Weight trend: stable Prior visit with dietician: no Current diet: well balanced Current exercise: none  ------------------------------------------------------------------------   Hypertension, follow-up:  BP Readings from Last 3 Encounters:  08/29/19 (!) 157/87  09/23/18 126/84  06/28/18 136/68    She was last seen for hypertension 1 months ago.  BP at that visit was 132 68 Management since that visit includes Benazepril 20 mg qd and Amlodipine 10 mg qd..She reports good compliance with treatment. She is not having side effects. She is not exercising. She is not adherent to low salt diet.   Outside blood pressures are not checked like they should.  ------------------------------------------------------------------------  No past medical history on file.   Past Surgical History:  Procedure Laterality Date  . ABDOMINAL HYSTERECTOMY  1998  . BUNIONECTOMY  2005  . CESAREAN SECTION  1993  . TUBAL LIGATION  1993    Family History  Problem Relation Age of Onset  . Ovarian cancer Mother   . Lung cancer Father   . Lung cancer Sister   . Bone cancer Sister   . CVA Sister   . Emphysema Brother   . Hypertension Sister   . Breast cancer Neg Hx    Allergies  Allergen Reactions  . Latex Itching    Current Outpatient Medications:  .  amLODipine (NORVASC) 10 MG tablet, TAKE 1 TABLET BY MOUTH DAILY, Disp: 90 tablet, Rfl: 0 .  benazepril (LOTENSIN) 20 MG tablet, TAKE 1 TABLET BY MOUTH DAILY, Disp: 90 tablet, Rfl: 0 .  fluticasone (FLONASE) 50 MCG/ACT nasal spray, 2 (TWO) SPRAYS IN EACH NOSTRIL AT BEDTIME, Disp: 16 g, Rfl: 2 .  ibuprofen (ADVIL,MOTRIN) 800 MG tablet, Take 1 tablet by mouth as needed., Disp: , Rfl:   Review of Systems  Constitutional: Negative.   Respiratory: Negative.   Genitourinary: Negative.   Musculoskeletal: Positive for back pain.  Neurological: Negative.    Social History   Tobacco Use  . Smoking status: Former Research scientist (life sciences)  . Smokeless tobacco: Never Used  . Tobacco comment: QUIT IN 2004  Substance Use Topics  . Alcohol use: No    Alcohol/week: 0.0 standard drinks     Objective:   BP (!) 157/87   Pulse 87   Temp 97.7 F (36.5 C) (Temporal)   Resp 16   Ht 5\' 8"  (1.727 m)   Wt 258 lb (117 kg)   LMP 07/19/1995 (Approximate)  BMI 39.23 kg/m  Vitals:   08/29/19 0826  BP: (!) 157/87  Pulse: 87  Resp: 16  Temp: 97.7 F (36.5 C)  TempSrc: Temporal  Weight: 258 lb (117 kg)  Height: 5\' 8"  (1.727 m)  Body mass index is 39.23 kg/m. Wt Readings from Last 3 Encounters:  08/29/19 258 lb (117 kg)  09/23/18 245 lb (111.1 kg)  06/28/18 254 lb (115.2 kg)   Physical Exam Constitutional:      General: She is not in acute distress.    Appearance: She is well-developed.  HENT:     Head: Normocephalic and atraumatic.     Right Ear: Hearing normal.     Left Ear: Hearing normal.     Nose: Nose normal.  Eyes:     General: Lids are normal. No scleral icterus.        Right eye: No discharge.        Left eye: No discharge.     Conjunctiva/sclera: Conjunctivae normal.  Neck:     Musculoskeletal: Neck supple.  Cardiovascular:     Rate and Rhythm: Normal rate.     Heart sounds: Normal heart sounds.  Pulmonary:     Effort: Pulmonary effort is normal. No respiratory distress.     Breath sounds: Wheezing present.     Comments: Minimal wheeze in left upper back that clears with cough. No rales or rhonchi. Tender at inferior border of right scapula. Musculoskeletal: Normal range of motion.  Skin:    Findings: No lesion or rash.  Neurological:     Mental Status: She is alert and oriented to person, place, and time.  Psychiatric:        Speech: Speech normal.        Behavior: Behavior normal.        Thought Content: Thought content normal.    Results for orders placed or performed in visit on 08/29/19  POCT HgB A1C  Result Value Ref Range   Hemoglobin A1C 5.9 (A) 4.0 - 5.6 %   Est. average glucose Bld gHb Est-mCnc 123       Assessment & Plan    1. Essential (primary) hypertension Tolerating Amlodipine 10 mg qd and Benazepril 20 mg qd without side effects. Check CBC and CMP fasting. Encouraged to lose weight and restrict salt intake. Follow up pending report.s  - CBC with Differential - Comprehensive Metabolic Panel (CMET)  2. Obstructive apnea Sleeping well with use of the CPAP each night. Husband feels she needs to use it when she sleeps in her chair/couch, also. Encouraged to work on weight loss.  3. Mild intermittent asthma without complication Noticed some wheezing intermittently that clears with a cough over the past month. Recommend Albuterol-HFA prn. If needing to use this more than 3-4 times a week, recommend adding a ICS/LABA. - albuterol (VENTOLIN HFA) 108 (90 Base) MCG/ACT inhaler; Inhale 2 puffs into the lungs every 6 (six) hours as needed for wheezing or shortness of breath.  Dispense: 8 g; Refill: 3  4. Family history of diabetes  mellitus Hgb A1C at 5.9% is in the early prediabetic range. She has gained 13 lbs since Dec. 2019. Encouraged to exercise regularly and reduce caloric intake. Weight loss should help this correct. Denies any polyuria, polydipsia or polyphagia. Recheck CBC and CMP fasting. - POCT HgB A1C - CBC with Differential - Comprehensive Metabolic Panel (CMET)  5. Seasonal allergic rhinitis, unspecified trigger Some stuffy nose and PND intermittently. No fever, sore throat or COVID symptoms. Refill  the Flonase and recheck prn. - fluticasone (FLONASE) 50 MCG/ACT nasal spray; 2 (TWO) SPRAYS IN EACH NOSTRIL AT BEDTIME  Dispense: 16 g; Refill: 2  6. Muscle strain Had a cough in October that caused a strain to some muscles in the right upper back. Improved with moist heat applications but still has some soreness remaining. Recommend Aspercreme with Lidocaine and Aleve BID. May use Percogesic at bedtime if additional muscle relaxant needed. Given rehab exercises.     Vernie Murders, PA  [facility]

## 2019-10-15 ENCOUNTER — Emergency Department: Payer: HRSA Program

## 2019-10-15 ENCOUNTER — Other Ambulatory Visit: Payer: Self-pay

## 2019-10-15 ENCOUNTER — Ambulatory Visit: Payer: Self-pay | Admitting: *Deleted

## 2019-10-15 DIAGNOSIS — Z8249 Family history of ischemic heart disease and other diseases of the circulatory system: Secondary | ICD-10-CM

## 2019-10-15 DIAGNOSIS — U071 COVID-19: Secondary | ICD-10-CM | POA: Diagnosis not present

## 2019-10-15 DIAGNOSIS — Z823 Family history of stroke: Secondary | ICD-10-CM

## 2019-10-15 DIAGNOSIS — J1282 Pneumonia due to coronavirus disease 2019: Secondary | ICD-10-CM | POA: Diagnosis present

## 2019-10-15 DIAGNOSIS — G4733 Obstructive sleep apnea (adult) (pediatric): Secondary | ICD-10-CM | POA: Diagnosis present

## 2019-10-15 DIAGNOSIS — J9601 Acute respiratory failure with hypoxia: Secondary | ICD-10-CM | POA: Diagnosis present

## 2019-10-15 DIAGNOSIS — Z801 Family history of malignant neoplasm of trachea, bronchus and lung: Secondary | ICD-10-CM

## 2019-10-15 DIAGNOSIS — A4189 Other specified sepsis: Secondary | ICD-10-CM | POA: Diagnosis present

## 2019-10-15 DIAGNOSIS — I10 Essential (primary) hypertension: Secondary | ICD-10-CM | POA: Diagnosis present

## 2019-10-15 DIAGNOSIS — J309 Allergic rhinitis, unspecified: Secondary | ICD-10-CM | POA: Diagnosis present

## 2019-10-15 DIAGNOSIS — Z79899 Other long term (current) drug therapy: Secondary | ICD-10-CM

## 2019-10-15 DIAGNOSIS — Z825 Family history of asthma and other chronic lower respiratory diseases: Secondary | ICD-10-CM

## 2019-10-15 DIAGNOSIS — Z87891 Personal history of nicotine dependence: Secondary | ICD-10-CM

## 2019-10-15 LAB — BASIC METABOLIC PANEL
Anion gap: 9 (ref 5–15)
BUN: 9 mg/dL (ref 6–20)
CO2: 27 mmol/L (ref 22–32)
Calcium: 8.6 mg/dL — ABNORMAL LOW (ref 8.9–10.3)
Chloride: 103 mmol/L (ref 98–111)
Creatinine, Ser: 0.6 mg/dL (ref 0.44–1.00)
GFR calc Af Amer: >60 mL/min (ref >60)
GFR calc non Af Amer: >60 mL/min (ref >60)
Glucose, Bld: 124 mg/dL — ABNORMAL HIGH (ref 70–99)
Potassium: 3.5 mmol/L (ref 3.5–5.1)
Sodium: 139 mmol/L (ref 135–145)

## 2019-10-15 LAB — CBC
HCT: 38.5 % (ref 36.0–46.0)
Hemoglobin: 12.5 g/dL (ref 12.0–15.0)
MCH: 29.1 pg (ref 26.0–34.0)
MCHC: 32.5 g/dL (ref 30.0–36.0)
MCV: 89.7 fL (ref 80.0–100.0)
Platelets: 218 10*3/uL (ref 150–400)
RBC: 4.29 MIL/uL (ref 3.87–5.11)
RDW: 13.5 % (ref 11.5–15.5)
WBC: 4.7 10*3/uL (ref 4.0–10.5)
nRBC: 0 % (ref 0.0–0.2)

## 2019-10-15 NOTE — ED Triage Notes (Signed)
Pt presents from [facility] c/o SOB and productive cough x2 weeks. Denies Covid exposure.

## 2019-10-15 NOTE — Telephone Encounter (Signed)
Pt called with having some shortness of breath. She stated that started about 2 weeks ago. She has a cough when she tries to take a deep breath. No fever, but feels congestion in her chest. She is requesting a medication called in for her. She has been dx with intermittent mild asthma and has been prescribed an inhaler. Which she stated she will pick up this afternoon. She is at work in [place] per protocol to go to an urgent care for treatment. To call them first. She voiced understanding. Will route to [facility] for review and any other recommendation.   Reason for Disposition . [1] MILD difficulty breathing (e.g., minimal/no SOB at rest, SOB with walking, pulse <100) AND [2] NEW-onset or WORSE than normal  Answer Assessment - Initial Assessment Questions 1. RESPIRATORY STATUS: "Describe your breathing?" (e.g., wheezing, shortness of breath, unable to speak, severe coughing)      Shortness of breath 2. ONSET: "When did this breathing problem begin?"      2 weeks ago 3. PATTERN "Does the difficult breathing come and go, or has it been constant since it started?"      constant 4. SEVERITY: "How bad is your breathing?" (e.g., mild, moderate, severe)    - MILD: No SOB at rest, mild SOB with walking, speaks normally in sentences, can lay down, no retractions, pulse < 100.    - MODERATE: SOB at rest, SOB with minimal exertion and prefers to sit, cannot lie down flat, speaks in phrases, mild retractions, audible wheezing, pulse 100-120.    - SEVERE: Very SOB at rest, speaks in single words, struggling to breathe, sitting hunched forward, retractions, pulse > 120     Mild  5. RECURRENT SYMPTOM: "Have you had difficulty breathing before?" If so, ask: "When was the last time?" and "What happened that time?"      Yes but not like this 6. CARDIAC HISTORY: "Do you have any history of heart disease?" (e.g., heart attack, angina, bypass surgery, angioplasty)      no 7. LUNG  HISTORY: "Do you have any history of lung disease?"  (e.g., pulmonary embolus, asthma, emphysema)     asthma 8. CAUSE: "What do you think is causing the breathing problem?"      Not sure 9. OTHER SYMPTOMS: "Do you have any other symptoms? (e.g., dizziness, runny nose, cough, chest pain, fever)     Cough when she gets really short of breath,  10. PREGNANCY: "Is there any chance you are pregnant?" "When was your last menstrual period?"       no 11. TRAVEL: "Have you traveled out of the country in the last month?" (e.g., travel history, exposures)       Traveled to [place]  Protocols used: BREATHING DIFFICULTY-A-AH

## 2019-10-16 ENCOUNTER — Inpatient Hospital Stay
Admission: EM | Admit: 2019-10-16 | Discharge: 2019-10-16 | DRG: 177 | Disposition: A | Discharge status: Other Acute Inpatient Hospital | Payer: HRSA Program | Source: Ambulatory Visit | Attending: Internal Medicine | Admitting: Internal Medicine

## 2019-10-16 ENCOUNTER — Encounter (HOSPITAL_COMMUNITY): Payer: Self-pay | Admitting: Internal Medicine

## 2019-10-16 ENCOUNTER — Inpatient Hospital Stay (HOSPITAL_COMMUNITY)
Admission: AD | Admit: 2019-10-16 | Discharge: 2019-10-18 | DRG: 177 | Disposition: A | Discharge status: Home / Self Care | Payer: HRSA Program | Source: Other Acute Inpatient Hospital | Attending: Internal Medicine | Admitting: Internal Medicine

## 2019-10-16 DIAGNOSIS — U071 COVID-19: Principal | ICD-10-CM | POA: Diagnosis present

## 2019-10-16 DIAGNOSIS — Z87891 Personal history of nicotine dependence: Secondary | ICD-10-CM | POA: Diagnosis not present

## 2019-10-16 DIAGNOSIS — Z8249 Family history of ischemic heart disease and other diseases of the circulatory system: Secondary | ICD-10-CM | POA: Diagnosis not present

## 2019-10-16 DIAGNOSIS — I10 Essential (primary) hypertension: Secondary | ICD-10-CM | POA: Diagnosis present

## 2019-10-16 DIAGNOSIS — Z825 Family history of asthma and other chronic lower respiratory diseases: Secondary | ICD-10-CM | POA: Diagnosis not present

## 2019-10-16 DIAGNOSIS — J1282 Pneumonia due to coronavirus disease 2019: Secondary | ICD-10-CM

## 2019-10-16 DIAGNOSIS — Z91040 Latex allergy status: Secondary | ICD-10-CM

## 2019-10-16 DIAGNOSIS — A4189 Other specified sepsis: Secondary | ICD-10-CM | POA: Diagnosis present

## 2019-10-16 DIAGNOSIS — Z823 Family history of stroke: Secondary | ICD-10-CM | POA: Diagnosis not present

## 2019-10-16 DIAGNOSIS — G4733 Obstructive sleep apnea (adult) (pediatric): Secondary | ICD-10-CM | POA: Diagnosis present

## 2019-10-16 DIAGNOSIS — J9601 Acute respiratory failure with hypoxia: Secondary | ICD-10-CM | POA: Diagnosis not present

## 2019-10-16 DIAGNOSIS — J309 Allergic rhinitis, unspecified: Secondary | ICD-10-CM | POA: Diagnosis present

## 2019-10-16 DIAGNOSIS — Z801 Family history of malignant neoplasm of trachea, bronchus and lung: Secondary | ICD-10-CM | POA: Diagnosis not present

## 2019-10-16 DIAGNOSIS — Z79899 Other long term (current) drug therapy: Secondary | ICD-10-CM | POA: Diagnosis not present

## 2019-10-16 LAB — ABO/RH: ABO/RH(D): O POS

## 2019-10-16 LAB — MAGNESIUM: Magnesium: 2.1 mg/dL (ref 1.7–2.4)

## 2019-10-16 LAB — RESPIRATORY PANEL BY RT PCR (FLU A&B, COVID)
Influenza A by PCR: NEGATIVE
Influenza B by PCR: NEGATIVE
SARS Coronavirus 2 by RT PCR: POSITIVE — AB

## 2019-10-16 LAB — POC SARS CORONAVIRUS 2 AG: SARS Coronavirus 2 Ag: NEGATIVE

## 2019-10-16 LAB — HIV ANTIBODY (ROUTINE TESTING W REFLEX): HIV Screen 4th Generation wRfx: NONREACTIVE

## 2019-10-16 MED ORDER — ASCORBIC ACID 500 MG PO TABS
500.0000 mg | ORAL_TABLET | Freq: Every day | ORAL | Status: DC
  Administered 2019-10-17 – 2019-10-18 (×2): 500 mg via ORAL
  Filled 2019-10-16 (×2): qty 1

## 2019-10-16 MED ORDER — ACETAMINOPHEN 325 MG PO TABS: 650.0000 mg | ORAL_TABLET | Freq: Four times a day (QID) | ORAL | Status: DC | PRN

## 2019-10-16 MED ORDER — FLUTICASONE PROPIONATE 50 MCG/ACT NA SUSP
2.0000 | Freq: Every day | NASAL | Status: DC
  Filled 2019-10-16: qty 16

## 2019-10-16 MED ORDER — AMLODIPINE BESYLATE 5 MG PO TABS
10.0000 mg | ORAL_TABLET | Freq: Every day | ORAL | Status: DC
  Administered 2019-10-16: 09:00:00 10 mg via ORAL
  Filled 2019-10-16: qty 2

## 2019-10-16 MED ORDER — GUAIFENESIN ER 600 MG PO TB12
600.0000 mg | ORAL_TABLET | Freq: Two times a day (BID) | ORAL | Status: DC
  Administered 2019-10-16: 09:00:00 600 mg via ORAL
  Filled 2019-10-16: qty 1

## 2019-10-16 MED ORDER — POTASSIUM CHLORIDE 20 MEQ PO PACK
40.0000 meq | PACK | Freq: Once | ORAL | Status: AC
  Administered 2019-10-16: 06:00:00 40 meq via ORAL
  Filled 2019-10-16: qty 2

## 2019-10-16 MED ORDER — ONDANSETRON HCL 4 MG/2ML IJ SOLN: 4.0000 mg | Freq: Four times a day (QID) | INTRAMUSCULAR | Status: DC | PRN

## 2019-10-16 MED ORDER — ONDANSETRON HCL 4 MG PO TABS: 4.0000 mg | ORAL_TABLET | Freq: Four times a day (QID) | ORAL | Status: DC | PRN

## 2019-10-16 MED ORDER — HYDROCOD POLST-CPM POLST ER 10-8 MG/5ML PO SUER: 5.0000 mL | Freq: Two times a day (BID) | ORAL | Status: DC | PRN

## 2019-10-16 MED ORDER — ALBUTEROL SULFATE HFA 108 (90 BASE) MCG/ACT IN AERS
2.0000 | INHALATION_SPRAY | Freq: Four times a day (QID) | RESPIRATORY_TRACT | Status: DC | PRN
  Filled 2019-10-16: qty 6.7

## 2019-10-16 MED ORDER — VITAMIN D 25 MCG (1000 UNIT) PO TABS
1000.0000 [IU] | ORAL_TABLET | Freq: Every day | ORAL | Status: DC
  Administered 2019-10-16: 09:00:00 1000 [IU] via ORAL
  Filled 2019-10-16: qty 1

## 2019-10-16 MED ORDER — GUAIFENESIN-DM 100-10 MG/5ML PO SYRP: 10.0000 mL | ORAL_SOLUTION | ORAL | Status: DC | PRN

## 2019-10-16 MED ORDER — BISACODYL 5 MG PO TBEC: 5.0000 mg | DELAYED_RELEASE_TABLET | Freq: Every day | ORAL | Status: DC | PRN

## 2019-10-16 MED ORDER — DEXAMETHASONE SODIUM PHOSPHATE 10 MG/ML IJ SOLN
6.0000 mg | INTRAMUSCULAR | Status: DC
  Administered 2019-10-17: 6 mg via INTRAVENOUS
  Filled 2019-10-16: qty 1

## 2019-10-16 MED ORDER — SODIUM CHLORIDE 0.9 % IV SOLN: 200.0000 mg | Freq: Once | INTRAVENOUS | Status: DC

## 2019-10-16 MED ORDER — SODIUM CHLORIDE 0.9 % IV SOLN: 100.0000 mg | Freq: Every day | INTRAVENOUS | Status: DC

## 2019-10-16 MED ORDER — ENOXAPARIN SODIUM 60 MG/0.6ML ~~LOC~~ SOLN
60.0000 mg | SUBCUTANEOUS | Status: DC
  Administered 2019-10-17 – 2019-10-18 (×2): 60 mg via SUBCUTANEOUS
  Filled 2019-10-16 (×2): qty 0.6

## 2019-10-16 MED ORDER — SODIUM CHLORIDE 0.9 % IV SOLN
100.0000 mg | Freq: Every day | INTRAVENOUS | Status: DC
  Administered 2019-10-17 – 2019-10-18 (×2): 100 mg via INTRAVENOUS
  Filled 2019-10-16 (×2): qty 20

## 2019-10-16 MED ORDER — ENOXAPARIN SODIUM 40 MG/0.4ML ~~LOC~~ SOLN: 40.0000 mg | SUBCUTANEOUS | Status: DC

## 2019-10-16 MED ORDER — FLUTICASONE PROPIONATE 50 MCG/ACT NA SUSP
2.0000 | Freq: Every day | NASAL | Status: DC
  Administered 2019-10-16 – 2019-10-17 (×2): 2 via NASAL
  Filled 2019-10-16: qty 16

## 2019-10-16 MED ORDER — MAGNESIUM HYDROXIDE 400 MG/5ML PO SUSP: 30.0000 mL | Freq: Every day | ORAL | Status: DC | PRN

## 2019-10-16 MED ORDER — AMLODIPINE BESYLATE 10 MG PO TABS
10.0000 mg | ORAL_TABLET | Freq: Every day | ORAL | Status: DC
  Administered 2019-10-16 – 2019-10-18 (×3): 10 mg via ORAL
  Filled 2019-10-16 (×3): qty 1

## 2019-10-16 MED ORDER — FAMOTIDINE 20 MG PO TABS
20.0000 mg | ORAL_TABLET | Freq: Two times a day (BID) | ORAL | Status: DC
  Administered 2019-10-16: 09:00:00 20 mg via ORAL
  Filled 2019-10-16: qty 1

## 2019-10-16 MED ORDER — ZINC SULFATE 220 (50 ZN) MG PO CAPS
220.0000 mg | ORAL_CAPSULE | Freq: Every day | ORAL | Status: DC
  Administered 2019-10-17 – 2019-10-18 (×2): 220 mg via ORAL
  Filled 2019-10-16 (×2): qty 1

## 2019-10-16 MED ORDER — ASCORBIC ACID 500 MG PO TABS
500.0000 mg | ORAL_TABLET | Freq: Every day | ORAL | Status: DC
  Administered 2019-10-16: 09:00:00 500 mg via ORAL
  Filled 2019-10-16: qty 1

## 2019-10-16 MED ORDER — BENAZEPRIL HCL 20 MG PO TABS
20.0000 mg | ORAL_TABLET | Freq: Every day | ORAL | Status: DC
  Administered 2019-10-16: 09:00:00 20 mg via ORAL
  Filled 2019-10-16: qty 1

## 2019-10-16 MED ORDER — ENOXAPARIN SODIUM 60 MG/0.6ML ~~LOC~~ SOLN
60.0000 mg | Freq: Two times a day (BID) | SUBCUTANEOUS | Status: DC
  Administered 2019-10-16: 60 mg via SUBCUTANEOUS
  Filled 2019-10-16: qty 0.6

## 2019-10-16 MED ORDER — SODIUM CHLORIDE 0.9 % IV SOLN: INTRAVENOUS | Status: DC

## 2019-10-16 MED ORDER — SODIUM CHLORIDE 0.9 % IV SOLN
1.0000 g | Freq: Once | INTRAVENOUS | Status: AC
  Administered 2019-10-16: 02:00:00 1 g via INTRAVENOUS
  Filled 2019-10-16: qty 10

## 2019-10-16 MED ORDER — SODIUM CHLORIDE 0.9 % IV SOLN
500.0000 mg | Freq: Once | INTRAVENOUS | Status: AC
  Administered 2019-10-16: 02:00:00 500 mg via INTRAVENOUS
  Filled 2019-10-16: qty 500

## 2019-10-16 MED ORDER — ASPIRIN EC 81 MG PO TBEC
81.0000 mg | DELAYED_RELEASE_TABLET | Freq: Every day | ORAL | Status: DC
  Administered 2019-10-16: 09:00:00 81 mg via ORAL
  Filled 2019-10-16: qty 1

## 2019-10-16 MED ORDER — ZINC SULFATE 220 (50 ZN) MG PO CAPS
220.0000 mg | ORAL_CAPSULE | Freq: Every day | ORAL | Status: DC
  Administered 2019-10-16: 09:00:00 220 mg via ORAL
  Filled 2019-10-16: qty 1

## 2019-10-16 MED ORDER — DEXAMETHASONE SODIUM PHOSPHATE 10 MG/ML IJ SOLN
6.0000 mg | INTRAMUSCULAR | Status: DC
  Administered 2019-10-16: 06:00:00 6 mg via INTRAVENOUS
  Filled 2019-10-16: qty 1

## 2019-10-16 MED ORDER — POLYETHYLENE GLYCOL 3350 17 G PO PACK: 17.0000 g | PACK | Freq: Every day | ORAL | Status: DC | PRN

## 2019-10-16 MED ORDER — SODIUM CHLORIDE 0.9 % IV SOLN
200.0000 mg | Freq: Once | INTRAVENOUS | Status: AC
  Administered 2019-10-16: 200 mg via INTRAVENOUS
  Filled 2019-10-16: qty 200

## 2019-10-16 MED ORDER — TRAMADOL HCL 50 MG PO TABS: 50.0000 mg | ORAL_TABLET | Freq: Four times a day (QID) | ORAL | Status: DC | PRN

## 2019-10-16 NOTE — ED Notes (Signed)
Pt assisted to bedside commode. Peri-care performed independently. Pt able to tolerate ambulation well with 2L. Respirations even and unlabored at this time.

## 2019-10-16 NOTE — ED Provider Notes (Signed)
[facility] Emergency Department Provider Note  ____________________________________________   First MD Initiated Contact with Patient 10/16/19 [phone]     (approximate)  I have reviewed the triage vital signs and the nursing notes.   HISTORY  Chief Complaint Shortness of Breath and Cough    HPI Rhonda Williamson is a 54 y.o. female with below list of previous medical conditions presents to the emergency department secondary to 2-week history of productive cough and dyspnea.  [facility] where she received 2 breathing treatments and subsequently sent to the emergency department.  Patient's oxygen saturation on arrival 93% with tachypnea with a respiratory rate of 22.  Patient also noted to be febrile oral temperature 100.3.  Patient denies any known Covid exposure.        History reviewed. No pertinent past medical history.  Patient Active Problem List   Diagnosis Date Noted  . CN (constipation) 05/11/2015  . LBP (low back pain) 05/11/2015  . Obstructive apnea 05/11/2015  . Mucositis oral 05/11/2015  . Family history of diabetes mellitus 03/29/2010  . Awareness of heartbeats 03/29/2010  . 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 Disease of hair and hair follicles [id number]  . Tobacco use 10/19/2008  . Apnea, sleep 10/19/2008  . Essential (primary) hypertension 12/28/2003    Past Surgical History:  Procedure Laterality Date  . ABDOMINAL HYSTERECTOMY  1998  . BUNIONECTOMY  2005  . CESAREAN SECTION  1993  . TUBAL LIGATION  1993    Prior to Admission medications   Medication Sig Start Date End Date Taking? Authorizing Provider  albuterol (VENTOLIN HFA) 108 (90 Base) MCG/ACT inhaler Inhale 2 puffs into the lungs every 6 (six) hours as needed for wheezing or shortness of breath. 08/29/19   Chrismon, Vickki Muff, PA  amLODipine (NORVASC) 10 MG tablet TAKE 1 TABLET BY MOUTH DAILY 08/08/19   Chrismon, Vickki Muff, PA  benazepril (LOTENSIN) 20 MG  tablet TAKE 1 TABLET BY MOUTH DAILY 08/08/19   Chrismon, Vickki Muff, PA  fluticasone (FLONASE) 50 MCG/ACT nasal spray 2 (TWO) SPRAYS IN EACH NOSTRIL AT BEDTIME 08/29/19   Chrismon, Vickki Muff, PA  ibuprofen (ADVIL,MOTRIN) 800 MG tablet Take 1 tablet by mouth as needed.    [provider]    Allergies Latex  Family History  Problem Relation Age of Onset  . Ovarian cancer Mother   . Lung cancer Father   . Lung cancer Sister   . Bone cancer Sister   . CVA Sister   . Emphysema Brother   . Hypertension Sister   . Breast cancer Neg Hx     Social History Social History   Tobacco Use  . Smoking status: Former Research scientist (life sciences)  . Smokeless tobacco: Never Used  . Tobacco comment: QUIT IN 2004  Substance Use Topics  . Alcohol use: No    Alcohol/week: 0.0 standard drinks  . Drug use: No    Review of Systems Constitutional: Positive for fever/chills Eyes: No visual changes. ENT: No sore throat. Cardiovascular: Denies chest pain. Respiratory: Positive for cough and dyspnea Gastrointestinal: No abdominal pain.  No nausea, no vomiting.  No diarrhea.  No constipation. Genitourinary: Negative for dysuria. Musculoskeletal: Negative for neck pain.  Negative for back pain. Integumentary: Negative for rash. Neurological: Negative for headaches, focal weakness or numbness.  ____________________________________________   PHYSICAL EXAM:  VITAL SIGNS: ED Triage Vitals  Enc Vitals Group     BP 10/15/19 1912 (!) 181/77     Pulse Rate  10/15/19 1912 (!) 108     Resp 10/15/19 1912 (!) 22     Temp 10/15/19 1912 100.3 F (37.9 C)     Temp Source 10/15/19 1912 Oral     SpO2 10/15/19 1912 93 %     Weight 10/15/19 1913 113.4 kg (250 lb)     Height --      Head Circumference --      Peak Flow --      Pain Score 10/15/19 1912 7     Pain Loc --      Pain Edu? --      Excl. in [place]? --     Constitutional: Alert and oriented.  Eyes: Conjunctivae are normal.  Mouth/Throat: Patient is wearing a  mask. Neck: No stridor.  No meningeal signs.   Cardiovascular: Normal rate, regular rhythm. Good peripheral circulation. Grossly normal heart sounds. Respiratory: Tachypnea bilateral rhonchi worse on the right. Gastrointestinal: Soft and nontender. No distention.  Musculoskeletal: No lower extremity tenderness nor edema. No gross deformities of extremities. Neurologic:  Normal speech and language. No gross focal neurologic deficits are appreciated.  Skin:  Skin is warm, dry and intact. Psychiatric: Mood and affect are normal. Speech and behavior are normal.  ____________________________________________   LABS (all labs ordered are listed, but only abnormal results are displayed)  Labs Reviewed  BASIC METABOLIC PANEL - Abnormal; Notable for the following components:      Result Value   Glucose, Bld 124 (*)    Calcium 8.6 (*)    All other components within normal limits  CBC  POC SARS CORONAVIRUS 2 AG -  ED  POC SARS CORONAVIRUS 2 AG -  ED   ____________________________________________  EKG ED ECG REPORT I, [place] N Saralynn Langhorst, the attending physician, personally viewed and interpreted this ECG.   Date: 10/15/2019  EKG Time: 7:15 PM  Rate: 104  Rhythm: Sinus tachycardia  Axis: Normal  Intervals: Normal  ST&T Change: None ________________  RADIOLOGY I, [place] N Harshan Kearley, personally viewed and evaluated these images (plain radiographs) as part of my medical decision making, as well as reviewing the written report by the radiologist.  ED MD interpretation: Nonspecific mild hazy airspace opacities in the right lung base possible early infectious etiology  Official radiology report(s): DG Chest 2 View  Result Date: 10/15/2019 CLINICAL DATA:  Shortness of breath EXAM: CHEST - 2 VIEW COMPARISON:  November 08, 2012 FINDINGS: The heart size and mediastinal contours are within normal limits. Mildly increased hazy airspace opacity seen at the right lung base. The left lung is clear. No  pleural effusion. No acute osseous abnormality. IMPRESSION: Nonspecific mildly hazy airspace opacity at the right lung base which could be due to atelectasis and/or early infectious etiology. Electronically Signed   By: Prudencio Pair M.D.   On: 10/15/2019 23:30         Procedures   ____________________________________________   INITIAL IMPRESSION / MDM / ASSESSMENT AND PLAN / ED COURSE  As part of my medical decision making, I reviewed the following data within the electronic MEDICAL RECORD NUMBER   54 year old female presented with above-stated history and physical exam concerning for COVID-19 infection and associated pneumonia.  Patient Covid test positive here in the emergency department chest x-ray consistent with possible early pneumonia.  Patient oxygen saturation at 93% drops lower with any exertion.  Patient given ceftriaxone 1 g IV azithromycin 500 mg IV Decadron 10 mg IV.  As such patient discussed with Dr. Danella Penton hospitalist for hospital  admission for further evaluation and management.     ____________________________________________  FINAL CLINICAL IMPRESSION(S) / ED DIAGNOSES  Final diagnoses:  Pneumonia due to COVID-19 virus     MEDICATIONS GIVEN DURING THIS VISIT:  Medications  cefTRIAXone (ROCEPHIN) 1 g in sodium chloride 0.9 % 100 mL IVPB (has no administration in time range)  azithromycin (ZITHROMAX) 500 mg in sodium chloride 0.9 % 250 mL IVPB (has no administration in time range)     ED Discharge Orders    None      *Please note:  Rhonda Williamson was evaluated in Emergency Department on 10/16/2019 for the symptoms described in the history of present illness. She was evaluated in the context of the global COVID-19 pandemic, which necessitated consideration that the patient might be at risk for infection with the SARS-CoV-2 virus that causes COVID-19. Institutional protocols and algorithms that pertain to the evaluation of patients at risk for COVID-19 are in a  state of rapid change based on information released by regulatory bodies including the CDC and federal and state organizations. These policies and algorithms were followed during the patient's care in the ED.  Some ED evaluations and interventions may be delayed as a result of limited staffing during the pandemic.*  Note:  This document was prepared using Dragon voice recognition software and may include unintentional dictation errors.   Gregor Hams, MD 10/16/19 [phone]

## 2019-10-16 NOTE — ED Notes (Signed)
Pt assisted to bedside commode. Pt oxygen saturation dropped to 88% with ambulation on room air. Pt placed back on 2L when she returned to bed. Pt oxygen saturation now 94% on 2L

## 2019-10-16 NOTE — H&P (Signed)
Rhonda Williamson at [facility] NAME: Rhonda Williamson    MR#:  [id number]  DATE OF BIRTH:  Feb 16, 1966  DATE OF ADMISSION:  10/16/2019  PRIMARY CARE PHYSICIAN: Margo Common, PA   REQUESTING/REFERRING PHYSICIAN: Marjean Donna, MD  CHIEF COMPLAINT:   Chief Complaint  Patient presents with  . Shortness of Breath  . Cough    HISTORY OF PRESENT ILLNESS:  Rhonda Williamson  is a 54 y.o. African-American female with a known history of tension, obstructive sleep apnea and allergic rhinitis, presented to the emergency room with acute onset of worsening cough with chest congestion as well as wheezing for the last week with inability to expectorate.  She denies any fever or chills.  No nausea vomiting or diarrhea.  She denied any chest pain or palpitations.  No abdominal pain.  No dysuria, oliguria or hematuria or flank pain.  Upon presentation to the emergency room, blood pressure was elevated 181/77 with a temperature of 100.3 and respirate of 22, heart rate of 108 and pulse ox 90 to 93% on room air which has been below 90 for any exertion.  Pulse oximetry was up to 95% on 2 L O2 by nasal cannula.  Labs revealed borderline potassium of 3.5 and unremarkable CBC.  Her COVID-19 PCR test came back positive.  Influenza AMB antigens came back negative.EKG showed sinus tachycardia with rate 104 with nonspecific T wave abnormalities.  Chest x-ray showed Nonspecific mildly hazy airspace opacity at the right lung base which could be due to atelectasis and/or early infectious etiology.  The patient was given IV Rocephin and Zithromax initially pending Covid results.  She will be started on IV remdesivir and steroids.  She will be admitted to medical monitored isolation bed for further evaluation and management. PAST MEDICAL HISTORY:  Hypertension, obstructive sleep apnea and allergic rhinitis  PAST SURGICAL HISTORY:   Past Surgical History:  Procedure Laterality Date  . ABDOMINAL  HYSTERECTOMY  1998  . BUNIONECTOMY  2005  . CESAREAN SECTION  1993  . TUBAL LIGATION  1993    SOCIAL HISTORY:   Social History   Tobacco Use  . Smoking status: Former Research scientist (life sciences)  . Smokeless tobacco: Never Used  . Tobacco comment: QUIT IN 2004  Substance Use Topics  . Alcohol use: No    Alcohol/week: 0.0 standard drinks    FAMILY HISTORY:   Family History  Problem Relation Age of Onset  . Ovarian cancer Mother   . Lung cancer Father   . Lung cancer Sister   . Bone cancer Sister   . CVA Sister   . Emphysema Brother   . Hypertension Sister   . Breast cancer Neg Hx     DRUG ALLERGIES:   Allergies  Allergen Reactions  . Latex Itching    REVIEW OF SYSTEMS:   ROS As per history of present illness. All pertinent systems were reviewed above. Constitutional,  HEENT, cardiovascular, respiratory, GI, GU, musculoskeletal, neuro, psychiatric, endocrine,  integumentary and hematologic systems were reviewed and are otherwise  negative/unremarkable except for positive findings mentioned above in the HPI.   MEDICATIONS AT HOME:   Prior to Admission medications   Medication Sig Start Date End Date Taking? Authorizing Provider  albuterol (VENTOLIN HFA) 108 (90 Base) MCG/ACT inhaler Inhale 2 puffs into the lungs every 6 (six) hours as needed for wheezing or shortness of breath. 08/29/19  Yes Chrismon, Vickki Muff, PA  amLODipine (NORVASC) 10 MG tablet TAKE 1 TABLET BY MOUTH  DAILY Patient taking differently: Take 10 mg by mouth daily.  08/08/19  Yes Chrismon, Vickki Muff, PA  benazepril (LOTENSIN) 20 MG tablet TAKE 1 TABLET BY MOUTH DAILY Patient taking differently: Take 20 mg by mouth daily.  08/08/19  Yes Chrismon, Vickki Muff, PA  fluticasone (FLONASE) 50 MCG/ACT nasal spray 2 (TWO) SPRAYS IN EACH NOSTRIL AT BEDTIME Patient taking differently: Place 2 sprays into both nostrils at bedtime.  08/29/19  Yes Chrismon, Vickki Muff, PA  ibuprofen (ADVIL,MOTRIN) 800 MG tablet Take 800 mg by mouth  every 8 (eight) hours as needed (pain).    Yes [provider]      VITAL SIGNS:  Blood pressure 138/71, pulse 92, temperature 99.4 F (37.4 C), temperature source Oral, resp. rate 20, weight 113.4 kg, last menstrual period 07/19/1995, SpO2 95 %.  PHYSICAL EXAMINATION:  Physical Exam  GENERAL:  54 y.o.-year-old African-American female patient lying in the bed with mild respiratory distress with conversational dyspnea.  EYES: Pupils equal, round, reactive to light and accommodation. No scleral icterus. Extraocular muscles intact.  HEENT: Head atraumatic, normocephalic. Oropharynx and nasopharynx clear.  NECK:  Supple, no jugular venous distention. No thyroid enlargement, no tenderness.  LUNGS: Diminished bibasal breath sounds with right basal crackles. CARDIOVASCULAR: Regular rate and rhythm, S1, S2 normal. No murmurs, rubs, or gallops.  ABDOMEN: Soft, nondistended, nontender. Bowel sounds present. No organomegaly or mass.  EXTREMITIES: No pedal edema, cyanosis, or clubbing.  NEUROLOGIC: Cranial nerves II through XII are intact. Muscle strength 5/5 in all extremities. Sensation intact. Gait not checked.  PSYCHIATRIC: The patient is alert and oriented x 3.  Normal affect and good eye contact. SKIN: No obvious rash, lesion, or ulcer.   LABORATORY PANEL:   CBC Recent Labs  Lab 10/15/19 1916  WBC 4.7  HGB 12.5  HCT 38.5  PLT 218   ------------------------------------------------------------------------------------------------------------------  Chemistries  Recent Labs  Lab 10/15/19 1916  NA 139  K 3.5  CL 103  CO2 27  GLUCOSE 124*  BUN 9  CREATININE 0.60  CALCIUM 8.6*   ------------------------------------------------------------------------------------------------------------------  Cardiac Enzymes No results for input(s): TROPONINI in the last 168 hours.  ------------------------------------------------------------------------------------------------------------------  RADIOLOGY:  DG Chest 2 View  Result Date: 10/15/2019 CLINICAL DATA:  Shortness of breath EXAM: CHEST - 2 VIEW COMPARISON:  November 08, 2012 FINDINGS: The heart size and mediastinal contours are within normal limits. Mildly increased hazy airspace opacity seen at the right lung base. The left lung is clear. No pleural effusion. No acute osseous abnormality. IMPRESSION: Nonspecific mildly hazy airspace opacity at the right lung base which could be due to atelectasis and/or early infectious etiology. Electronically Signed   By: Prudencio Pair M.D.   On: 10/15/2019 23:30      IMPRESSION AND PLAN:   1.  Acute hypoxemic respiratory failure secondary to COVID-19. -The patient will be admitted to a medically monitored isolation bed. -O2 protocol will be followed to keep O2 saturation above 93.   2.  Multifocal pneumonia subsequent sepsis secondary to COVID-19. -The patient will be admitted to an isolation monitored bed with droplet and contact precautions. -Given multifocal pneumonia we will empirically place the patient on IV Rocephin and Zithromax for possible bacterial superinfection only with elevated Procalcitonin. -The patient will be placed on scheduled Mucinex and as needed Tussionex. -We will avoid nebulization as much as we can, give bronchodilator MDI if needed, and with deterioration of oxygenation try to avoid BiPAP/CPAP if possible.    -Will obtain sputum Gram stain  culture and sensitivity and follow blood cultures. -O2 protocol will be followed. -We will follow CRP, ferritin, LDH and D-dimer. -Will follow manual differential for ANC/ALC ratio as well as follow troponin I and daily CBC with manual differential and CMP. - Will place the patient on IV Remdisivir and IV steroid therapy with Decadron with elevated inflammatory markers. - I discussed convalescent plasma  benefits and risks with the patient who wanted to think about it and let us know.   -The patient will be placed on vitamin D3, vitamin C, zinc sulfate, p.o. Pepcid and aspirin.  3.  Essential hypertension. -We will continue amlodipine and benazepril.  4.  Obstructive sleep apnea. -We will continue her home CPAP.  5.  Allergic rhinitis. -We will continue Flonase.  6.  DVT prophylaxis. -Subcutaneous Lovenox    All the records are reviewed and case discussed with ED provider. The plan of care was discussed in details with the patient (and family). I answered all questions. The patient agreed to proceed with the above mentioned plan. Further management will depend upon hospital course.   CODE STATUS: Full code  TOTAL TIME TAKING CARE OF THIS PATIENT: 55 minutes.    Christel Mormon M.D on 10/16/2019 at 4:29 AM  [facility]   From 7 PM-7 AM, contact night-coverage [email or web address]  CC: Primary care physician; Chrismon, Vickki Muff, PA   Note: This dictation was prepared with Dragon dictation along with smaller phrase technology. Any transcriptional errors that result from this process are unintentional.

## 2019-10-16 NOTE — Plan of Care (Signed)
  Problem: Education: Goal: Knowledge of risk factors and measures for prevention of condition will improve Outcome: Progressing   Problem: Coping: Goal: Psychosocial and spiritual needs will be supported Outcome: Progressing   Problem: Respiratory: Goal: Will maintain a patent airway Outcome: Progressing Goal: Complications related to the disease process, condition or treatment will be avoided or minimized Outcome: Progressing   Problem: Education: Goal: Knowledge of General Education information will improve Description: Including pain rating scale, medication(s)/side effects and non-pharmacologic comfort measures Outcome: Progressing   Problem: Health Behavior/Discharge Planning: Goal: Ability to manage health-related needs will improve Outcome: Progressing   Problem: Clinical Measurements: Goal: Ability to maintain clinical measurements within normal limits will improve Outcome: Progressing Goal: Will remain free from infection Outcome: Progressing Goal: Diagnostic test results will improve Outcome: Progressing Goal: Respiratory complications will improve Outcome: Progressing Goal: Cardiovascular complication will be avoided Outcome: Progressing   Problem: Nutrition: Goal: Adequate nutrition will be maintained Outcome: Progressing   Problem: Coping: Goal: Level of anxiety will decrease Outcome: Progressing   Problem: Elimination: Goal: Will not experience complications related to bowel motility Outcome: Progressing Goal: Will not experience complications related to urinary retention Outcome: Progressing   Problem: Pain Managment: Goal: General experience of comfort will improve Outcome: Progressing   Problem: Safety: Goal: Ability to remain free from injury will improve Outcome: Progressing   Problem: Skin Integrity: Goal: Risk for impaired skin integrity will decrease Outcome: Progressing

## 2019-10-16 NOTE — ED Notes (Signed)
This RN attempted to give report to [facility] at this time.

## 2019-10-16 NOTE — ED Notes (Signed)
E-signature not working at this time. Pt verbalizes consent to be transferred to [facility]. Pt verbalizes understanding of risks of transfer. No further questions at this time.

## 2019-10-16 NOTE — Discharge Summary (Signed)
Brief discharge note.  Please see same day H&P for full billable details.  Patient is a 54 year old female was admitted to [facility] on 10/16/2019 with chief complaint of weakness and shortness of breath.  Was found to be Covid positive.  At time of admission and at my evaluation was requiring 2 L of supplemental oxygen.  At time of my evaluation patient was stable comfortable on 2 L without increased work of breathing.  Received notification that [facility] was excepting patients outside of traditional transfer algorithm.  [facility] transfer called and patient was accepted for transfer to [facility].  Explained transfer to the patient and she is in agreement.  All vital signs are stable at time of my evaluation.  Patient was then transferred in stable condition to [facility].  Please see updated history and physical for full details of admission and hospital course at [facility].  Ralene Muskrat MD

## 2019-10-16 NOTE — H&P (Signed)
Rhonda Williamson  [id number] DOB: Oct 18, 1965 DOA: 10/16/2019 PCP: Margo Common, PA    Brief Narrative:  54 year old with a history of HTN, OSA, and allergic rhinitis who presented to the ED with the acute onset of cough, chest congestion, and wheezing.  She denied fevers nausea vomiting diarrhea or shortness of breath.  In the ED her oxygen saturation was originally 90-93% on room air and dropped well below 90 on exertion.  A Covid PCR test was positive.  CXR noted nonspecific mild hazy airspace opacities at the right lung base.  IV remdesivir and steroid were initiated in the ED and the patient was admitted to [facility] then transferred to [facility].   Significant Events: 1/14 admit to [facility] via [facility] ED   COVID-19 specific Treatment: Decadron 1/14 > Remdesivir 1/14 >  Antimicrobials:  Rocephin 1/14 Azithromycin 1/14  Subjective: Pt was seen for a follow-up visit. She is resting comfortably in bed and states she is feeling better overall.   Assessment & Plan:  COVID Pneumonia - acute hypoxic respiratory failure Continue remdesivir and Decadron  Essential hypertension continue amlodipine and benazepril  Obstructive sleep apnea Will attempt to avoid nightly CPAP for now on a very short term basis   Allergic rhinitis continue Flonase  DVT prophylaxis: lovenox  Code Status: FULL CODE Family Communication:  Disposition Plan: tele bed   Consultants:  none  Objective: Blood pressure (!) 165/85, pulse 96, temperature 99 F (37.2 C), temperature source Oral, resp. rate 20, height 5\' 8"  (1.727 m), weight 113.4 kg, last menstrual period 07/19/1995, SpO2 95 %. No intake or output data in the 24 hours ending 10/16/19 1604 Filed Weights   10/16/19 1131  Weight: 113.4 kg    Examination: Follow up exam completed  CBC: Recent Labs  Lab 10/15/19 1916  WBC 4.7  HGB 12.5  HCT 38.5  MCV 89.7  PLT 99991111   Basic Metabolic Panel: Recent Labs  Lab 10/15/19 1916  10/16/19 0527  NA 139  --   K 3.5  --   CL 103  --   CO2 27  --   GLUCOSE 124*  --   BUN 9  --   CREATININE 0.60  --   CALCIUM 8.6*  --   MG  --  2.1   GFR: Estimated Creatinine Clearance: 106.2 mL/min (by C-G formula based on SCr of 0.6 mg/dL).  Liver Function Tests: No results for input(s): AST, ALT, ALKPHOS, BILITOT, PROT, ALBUMIN in the last 168 hours. No results for input(s): LIPASE, AMYLASE in the last 168 hours. No results for input(s): AMMONIA in the last 168 hours.  Coagulation Profile: No results for input(s): INR, PROTIME in the last 168 hours.  Cardiac Enzymes: No results for input(s): CKTOTAL, CKMB, CKMBINDEX, TROPONINI in the last 168 hours.  HbA1C: Hemoglobin A1C  Date/Time Value Ref Range Status  08/29/2019 08:44 AM 5.9 (A) 4.0 - 5.6 % Final  06/28/2018 10:07 AM 6.2 (A) 4.0 - 5.6 % Final    CBG: No results for input(s): GLUCAP in the last 168 hours.  Recent Results (from the past 240 hour(s))  Respiratory Panel by RT PCR (Flu A&B, Covid) - Nasopharyngeal Swab     Status: Abnormal   Collection Time: 10/16/19  1:32 AM   Specimen: Nasopharyngeal Swab  Result Value Ref Range Status   SARS Coronavirus 2 by RT PCR POSITIVE (A) NEGATIVE Final    Comment: RESULT CALLED TO, READ BACK BY AND VERIFIED WITH: [place] [phone]  10/16/19 HNM (NOTE) SARS-CoV-2 target nucleic acids are DETECTED. SARS-CoV-2 RNA is generally detectable in upper respiratory specimens  during the acute phase of infection. Positive results are indicative of the presence of the identified virus, but do not rule out bacterial infection or co-infection with other pathogens not detected by the test. Clinical correlation with patient history and other diagnostic information is necessary to determine patient infection status. The expected result is Negative. Fact Sheet for Patients:  PinkCheek.be Fact Sheet for Healthcare  Providers: GravelBags.it This test is not yet approved or cleared by the [place] FDA and  has been authorized for detection and/or diagnosis of SARS-CoV-2 by FDA under an Emergency Use Authorization (EUA).  This EUA will remain in effect (meaning this test can be used)  for the duration of  the COVID-19 declaration under Section 564(b)(1) of the Act, 21 U.S.C. section 360bbb-3(b)(1), unless the authorization is terminated or revoked sooner.    Influenza A by PCR NEGATIVE NEGATIVE Final   Influenza B by PCR NEGATIVE NEGATIVE Final    Comment: (NOTE) The Xpert Xpress SARS-CoV-2/FLU/RSV assay is intended as an aid in  the diagnosis of influenza from Nasopharyngeal swab specimens and  should not be used as a sole basis for treatment. Nasal washings and  aspirates are unacceptable for Xpert Xpress SARS-CoV-2/FLU/RSV  testing. Fact Sheet for Patients: PinkCheek.be Fact Sheet for Healthcare Providers: GravelBags.it This test is not yet approved or cleared by the [place] FDA and  has been authorized for detection and/or diagnosis of SARS-CoV-2 by  FDA under an Emergency Use Authorization (EUA). This EUA will remain  in effect (meaning this test can be used) for the duration of the  Covid-19 declaration under Section 564(b)(1) of the Act, 21  U.S.C. section 360bbb-3(b)(1), unless the authorization is  terminated or revoked. Performed at [facility], [address] [place], [place] [address]   Culture, blood (Routine X 2) w Reflex to ID Panel     Status: None (Preliminary result)   Collection Time: 10/16/19  4:38 AM   Specimen: BLOOD  Result Value Ref Range Status   Specimen Description BLOOD RIGHT FA  Final   Special Requests   Final    BOTTLES DRAWN AEROBIC AND ANAEROBIC Blood Culture results may not be optimal due to an inadequate volume of blood received in culture bottles    Culture   Final    NO GROWTH < 12 HOURS Performed at [facility], [address], [place], [place] [address]    Report Status PENDING  Incomplete     Scheduled Meds: Continuous Infusions:   LOS: 0 days   Cherene Altes, MD [facility] Office  [phone] Pager - Text Page per Amion  If 7PM-7AM, please contact night-coverage per Amion 10/16/2019, 4:04 PM

## 2019-10-17 LAB — MAGNESIUM: Magnesium: 2.2 mg/dL (ref 1.7–2.4)

## 2019-10-17 LAB — COMPREHENSIVE METABOLIC PANEL
ALT: 41 U/L (ref 0–44)
AST: 44 U/L — ABNORMAL HIGH (ref 15–41)
Albumin: 3.6 g/dL (ref 3.5–5.0)
Alkaline Phosphatase: 53 U/L (ref 38–126)
Anion gap: 9 (ref 5–15)
BUN: 12 mg/dL (ref 6–20)
CO2: 25 mmol/L (ref 22–32)
Calcium: 8.6 mg/dL — ABNORMAL LOW (ref 8.9–10.3)
Chloride: 106 mmol/L (ref 98–111)
Creatinine, Ser: 0.49 mg/dL (ref 0.44–1.00)
GFR calc Af Amer: >60 mL/min (ref >60)
GFR calc non Af Amer: >60 mL/min (ref >60)
Glucose, Bld: 129 mg/dL — ABNORMAL HIGH (ref 70–99)
Potassium: 3.9 mmol/L (ref 3.5–5.1)
Sodium: 140 mmol/L (ref 135–145)
Total Bilirubin: 0.4 mg/dL (ref 0.3–1.2)
Total Protein: 7.4 g/dL (ref 6.5–8.1)

## 2019-10-17 LAB — CBC WITH DIFFERENTIAL/PLATELET
Abs Immature Granulocytes: 0 10*3/uL (ref 0.00–0.07)
Basophils Absolute: 0 10*3/uL (ref 0.0–0.1)
Basophils Relative: 0 %
Eosinophils Absolute: 0 10*3/uL (ref 0.0–0.5)
Eosinophils Relative: 0 %
HCT: 38.2 % (ref 36.0–46.0)
Hemoglobin: 12.4 g/dL (ref 12.0–15.0)
Immature Granulocytes: 0 %
Lymphocytes Relative: 49 %
Lymphs Abs: 1.6 10*3/uL (ref 0.7–4.0)
MCH: 29.5 pg (ref 26.0–34.0)
MCHC: 32.5 g/dL (ref 30.0–36.0)
MCV: 91 fL (ref 80.0–100.0)
Monocytes Absolute: 0.2 10*3/uL (ref 0.1–1.0)
Monocytes Relative: 6 %
Neutro Abs: 1.5 10*3/uL — ABNORMAL LOW (ref 1.7–7.7)
Neutrophils Relative %: 45 %
Platelets: 254 10*3/uL (ref 150–400)
RBC: 4.2 MIL/uL (ref 3.87–5.11)
RDW: 13.4 % (ref 11.5–15.5)
WBC: 3.3 10*3/uL — ABNORMAL LOW (ref 4.0–10.5)
nRBC: 0 % (ref 0.0–0.2)

## 2019-10-17 LAB — D-DIMER, QUANTITATIVE: D-Dimer, Quant: 0.29 ug/mL-FEU (ref 0.00–0.50)

## 2019-10-17 LAB — FERRITIN: Ferritin: 419 ng/mL — ABNORMAL HIGH (ref 11–307)

## 2019-10-17 LAB — C-REACTIVE PROTEIN: CRP: 1.6 mg/dL — ABNORMAL HIGH (ref <1.0)

## 2019-10-17 LAB — ABO/RH: ABO/RH(D): O POS

## 2019-10-17 MED ORDER — DEXAMETHASONE 6 MG PO TABS
6.0000 mg | ORAL_TABLET | Freq: Every day | ORAL | Status: DC
  Administered 2019-10-18: 12:00:00 6 mg via ORAL
  Filled 2019-10-17: qty 1

## 2019-10-17 NOTE — Progress Notes (Signed)
Rhonda Williamson  [id number] DOB: [birth year] DOA: 10/16/2019 PCP: Margo Common, PA    Brief Narrative:  54 year old with a history of HTN, OSA, and allergic rhinitis who presented to the ED with the acute onset of cough, chest congestion, and wheezing.  She denied fevers nausea vomiting diarrhea or shortness of breath.  In the ED her oxygen saturation was originally 90-93% on room air and dropped well below 90 on exertion.  A Covid PCR test was positive.  CXR noted nonspecific mild hazy airspace opacities at the right lung base.  IV remdesivir and steroid were initiated in the ED and the patient was admitted to [facility] then transferred to [facility].   Significant Events: 1/14 admit to [facility] via [facility] ED   COVID-19 specific Treatment: Decadron 1/14 > Remdesivir 1/14 >  Antimicrobials:  Rocephin 1/14 Azithromycin 1/14  Subjective: Resting comfortably in bed. Reports that she is much less sob. Some chest wall pain w/ couging. No abdom pain. Good appetite. No diarrhea.   Assessment & Plan:  COVID Pneumonia - acute hypoxic respiratory failure Continue remdesivir and Decadron - ambulate and check sats - wean O2 - possible d/c home soon   Recent Labs  Lab 10/17/19 0130  DDIMER 0.29  FERRITIN 419*  CRP 1.6*  ALT 41    Essential hypertension continue amlodipine and benazepril - follow w/o change today   Obstructive sleep apnea Will attempt to avoid nightly CPAP for now on a very short term basis   Allergic rhinitis continue Flonase  DVT prophylaxis: lovenox  Code Status: FULL CODE Family Communication:  Disposition Plan: med/surg bed - ambulate - wean O2 - ?d/c 1/16  Consultants:  none  Objective: Blood pressure (!) 155/79, pulse 86, temperature 98.5 F (36.9 C), temperature source Oral, resp. rate 20, height 5\' 8"  (1.727 m), weight 113.4 kg, last menstrual period 07/19/1995, SpO2 97 %.  Intake/Output Summary (Last 24 hours) at 10/17/2019 1032 Last data filed at  10/16/2019 1628 Gross per 24 hour  Intake 480 ml  Output --  Net 480 ml   Filed Weights   10/16/19 1131  Weight: 113.4 kg    Examination: General: No acute respiratory distress Lungs: faint bibasilar crackles - no wheezing  Cardiovascular: Regular rate and rhythm without murmur gallop or rub normal S1 and S2 Abdomen: Nontender, nondistended, soft, bowel sounds positive, no rebound, no ascites, no appreciable mass Extremities: No significant cyanosis, clubbing, or edema bilateral lower extremities  CBC: Recent Labs  Lab 10/15/19 1916 10/17/19 0130  WBC 4.7 3.3*  NEUTROABS  --  1.5*  HGB 12.5 12.4  HCT 38.5 38.2  MCV 89.7 91.0  PLT 218 0000000   Basic Metabolic Panel: Recent Labs  Lab 10/15/19 1916 10/16/19 0527 10/17/19 0130  NA 139  --  140  K 3.5  --  3.9  CL 103  --  106  CO2 27  --  25  GLUCOSE 124*  --  129*  BUN 9  --  12  CREATININE 0.60  --  0.49  CALCIUM 8.6*  --  8.6*  MG  --  2.1 2.2   GFR: Estimated Creatinine Clearance: 106.2 mL/min (by C-G formula based on SCr of 0.49 mg/dL).  Liver Function Tests: Recent Labs  Lab 10/17/19 0130  AST 44*  ALT 41  ALKPHOS 53  BILITOT 0.4  PROT 7.4  ALBUMIN 3.6    HbA1C: Hemoglobin A1C  Date/Time Value Ref Range Status  08/29/2019 08:44 AM 5.9 (A) 4.0 -  5.6 % Final  06/28/2018 10:07 AM 6.2 (A) 4.0 - 5.6 % Final     Recent Results (from the past 240 hour(s))  Respiratory Panel by RT PCR (Flu A&B, Covid) - Nasopharyngeal Swab     Status: Abnormal   Collection Time: 10/16/19  1:32 AM   Specimen: Nasopharyngeal Swab  Result Value Ref Range Status   SARS Coronavirus 2 by RT PCR POSITIVE (A) NEGATIVE Final    Comment: RESULT CALLED TO, READ BACK BY AND VERIFIED WITH: Johnney Ou RN [phone] 10/16/19 HNM (NOTE) SARS-CoV-2 target nucleic acids are DETECTED. SARS-CoV-2 RNA is generally detectable in upper respiratory specimens  during the acute phase of infection. Positive results are indicative of the  presence of the identified virus, but do not rule out bacterial infection or co-infection with other pathogens not detected by the test. Clinical correlation with patient history and other diagnostic information is necessary to determine patient infection status. The expected result is Negative. Fact Sheet for Patients:  PinkCheek.be Fact Sheet for Healthcare Providers: GravelBags.it This test is not yet approved or cleared by the [place] FDA and  has been authorized for detection and/or diagnosis of SARS-CoV-2 by FDA under an Emergency Use Authorization (EUA).  This EUA will remain in effect (meaning this test can be used)  for the duration of  the COVID-19 declaration under Section 564(b)(1) of the Act, 21 U.S.C. section 360bbb-3(b)(1), unless the authorization is terminated or revoked sooner.    Influenza A by PCR NEGATIVE NEGATIVE Final   Influenza B by PCR NEGATIVE NEGATIVE Final    Comment: (NOTE) The Xpert Xpress SARS-CoV-2/FLU/RSV assay is intended as an aid in  the diagnosis of influenza from Nasopharyngeal swab specimens and  should not be used as a sole basis for treatment. Nasal washings and  aspirates are unacceptable for Xpert Xpress SARS-CoV-2/FLU/RSV  testing. Fact Sheet for Patients: PinkCheek.be Fact Sheet for Healthcare Providers: GravelBags.it This test is not yet approved or cleared by the [place] FDA and  has been authorized for detection and/or diagnosis of SARS-CoV-2 by  FDA under an Emergency Use Authorization (EUA). This EUA will remain  in effect (meaning this test can be used) for the duration of the  Covid-19 declaration under Section 564(b)(1) of the Act, 21  U.S.C. section 360bbb-3(b)(1), unless the authorization is  terminated or revoked. Performed at [facility], [address] [place], [place] [address]     Culture, blood (Routine X 2) w Reflex to ID Panel     Status: None (Preliminary result)   Collection Time: 10/16/19  4:38 AM   Specimen: BLOOD  Result Value Ref Range Status   Specimen Description BLOOD RIGHT FA  Final   Special Requests   Final    BOTTLES DRAWN AEROBIC AND ANAEROBIC Blood Culture results may not be optimal due to an inadequate volume of blood received in culture bottles   Culture   Final    NO GROWTH 1 DAY Performed at [facility], [address] [place], [place] [address]    Report Status PENDING  Incomplete      LOS: 1 day   Cherene Altes, MD [facility] Office  [phone] Pager - Text Page per Amion  If 7PM-7AM, please contact night-coverage per Amion 10/17/2019, 10:32 AM

## 2019-10-17 NOTE — Evaluation (Signed)
Physical Therapy Evaluation Patient Details Name: Rhonda Williamson MRN: [id number] DOB: 06/12/1966 Today's Date: 10/17/2019   History of Present Illness  Pt adm with acute hypoxic respiratory failure due to covid PNA. PMH - htn, osa, obesity  Clinical Impression  Pt doing well with mobilty but with decr activity tolerance. Pt able to tolerated amb on RA with SpO2 89% using nelcor forehead probe. Will need reinforcement to self monitor activity tolerance. Will follow acutely but will not need PT at DC.     Follow Up Recommendations No PT follow up    Equipment Recommendations  None recommended by PT    Recommendations for Other Services       Precautions / Restrictions Precautions Precautions: Other (comment) Precaution Comments: watch SpO2      Mobility  Bed Mobility Overal bed mobility: Independent                Transfers Overall transfer level: Independent Equipment used: None                Ambulation/Gait Ambulation/Gait assistance: Supervision Gait Distance (Feet): 300 Feet(x 2) Assistive device: None Gait Pattern/deviations: Step-to pattern;Decreased stride length Gait velocity: decr Gait velocity interpretation: >2.62 ft/sec, indicative of community ambulatory General Gait Details: supervision for lines/tubes. Pt amb on RA with dyspnea 2/4. SpO2 down to 89% using Nelcor forehead probe  Stairs            Wheelchair Mobility    Modified Rankin (Stroke Patients Only)       Balance Overall balance assessment: No apparent balance deficits (not formally assessed)                                           Pertinent Vitals/Pain Pain Assessment: No/denies pain    Home Living Family/patient expects to be discharged to:: Private residence Living Arrangements: Spouse/significant other Available Help at Discharge: Family;Available PRN/intermittently Type of Home: House Home Access: Level entry     Home Layout: One  level Home Equipment: None      Prior Function Level of Independence: Independent         Comments: works as Chief Strategy Officer        Extremity/Trunk Assessment   Upper Extremity Assessment Upper Extremity Assessment: Overall WFL for tasks assessed    Lower Extremity Assessment Lower Extremity Assessment: Overall WFL for tasks assessed       Communication   Communication: No difficulties  Cognition Arousal/Alertness: Awake/alert Behavior During Therapy: WFL for tasks assessed/performed Overall Cognitive Status: Within Functional Limits for tasks assessed                                        General Comments      Exercises Other Exercises Other Exercises: incentive spirometer x 5 Other Exercises: flutter valve x 5   Assessment/Plan    PT Assessment Patient needs continued PT services  PT Problem List Decreased activity tolerance;Cardiopulmonary status limiting activity       PT Treatment Interventions Gait training;Patient/family education    PT Goals (Current goals can be found in the Care Plan section)  Acute Rehab PT Goals Patient Stated Goal: return home PT Goal Formulation: With patient Time For Goal Achievement: 10/24/19 Potential to Achieve Goals: Good  Frequency Min 2X/week   Barriers to discharge        Co-evaluation               AM-PAC PT "6 Clicks" Mobility  Outcome Measure Help needed turning from your back to your side while in a flat bed without using bedrails?: None Help needed moving from lying on your back to sitting on the side of a flat bed without using bedrails?: None Help needed moving to and from a bed to a chair (including a wheelchair)?: None Help needed standing up from a chair using your arms (e.g., wheelchair or bedside chair)?: None Help needed to walk in hospital room?: None Help needed climbing 3-5 steps with a railing? : None 6 Click Score: 24    End of  Session   Activity Tolerance: Patient tolerated treatment well Patient left: in chair;with call bell/phone within reach Nurse Communication: Mobility status;Other (comment)(SpO2 readings) PT Visit Diagnosis: Difficulty in walking, not elsewhere classified (R26.2)    Time: [id number] PT Time Calculation (min) (ACUTE ONLY): 32 min   Charges:   PT Evaluation $PT Eval Low Complexity: 1 Low PT Treatments $Gait Training: 8-22 mins        [place] Pager [phone] Office [place] 10/17/2019, 2:25 PM

## 2019-10-18 LAB — COMPREHENSIVE METABOLIC PANEL
ALT: 43 U/L (ref 0–44)
AST: 40 U/L (ref 15–41)
Albumin: 3.6 g/dL (ref 3.5–5.0)
Alkaline Phosphatase: 62 U/L (ref 38–126)
Anion gap: 10 (ref 5–15)
BUN: 15 mg/dL (ref 6–20)
CO2: 25 mmol/L (ref 22–32)
Calcium: 9.1 mg/dL (ref 8.9–10.3)
Chloride: 103 mmol/L (ref 98–111)
Creatinine, Ser: 0.52 mg/dL (ref 0.44–1.00)
GFR calc Af Amer: >60 mL/min (ref >60)
GFR calc non Af Amer: >60 mL/min (ref >60)
Glucose, Bld: 118 mg/dL — ABNORMAL HIGH (ref 70–99)
Potassium: 3.8 mmol/L (ref 3.5–5.1)
Sodium: 138 mmol/L (ref 135–145)
Total Bilirubin: 0.3 mg/dL (ref 0.3–1.2)
Total Protein: 7.6 g/dL (ref 6.5–8.1)

## 2019-10-18 LAB — CBC WITH DIFFERENTIAL/PLATELET
Abs Immature Granulocytes: 0.02 10*3/uL (ref 0.00–0.07)
Basophils Absolute: 0 10*3/uL (ref 0.0–0.1)
Basophils Relative: 0 %
Eosinophils Absolute: 0 10*3/uL (ref 0.0–0.5)
Eosinophils Relative: 0 %
HCT: 41.1 % (ref 36.0–46.0)
Hemoglobin: 12.9 g/dL (ref 12.0–15.0)
Immature Granulocytes: 0 %
Lymphocytes Relative: 39 %
Lymphs Abs: 1.9 10*3/uL (ref 0.7–4.0)
MCH: 28.6 pg (ref 26.0–34.0)
MCHC: 31.4 g/dL (ref 30.0–36.0)
MCV: 91.1 fL (ref 80.0–100.0)
Monocytes Absolute: 0.3 10*3/uL (ref 0.1–1.0)
Monocytes Relative: 6 %
Neutro Abs: 2.7 10*3/uL (ref 1.7–7.7)
Neutrophils Relative %: 55 %
Platelets: 347 10*3/uL (ref 150–400)
RBC: 4.51 MIL/uL (ref 3.87–5.11)
RDW: 13.3 % (ref 11.5–15.5)
WBC: 4.9 10*3/uL (ref 4.0–10.5)
nRBC: 0 % (ref 0.0–0.2)

## 2019-10-18 LAB — C-REACTIVE PROTEIN: CRP: 1.5 mg/dL — ABNORMAL HIGH (ref <1.0)

## 2019-10-18 LAB — FERRITIN: Ferritin: 436 ng/mL — ABNORMAL HIGH (ref 11–307)

## 2019-10-18 LAB — D-DIMER, QUANTITATIVE: D-Dimer, Quant: 0.37 ug/mL-FEU (ref 0.00–0.50)

## 2019-10-18 MED ORDER — ACETAMINOPHEN 325 MG PO TABS: 650.0000 mg | ORAL_TABLET | Freq: Four times a day (QID) | ORAL | Status: AC | PRN

## 2019-10-18 NOTE — Discharge Instructions (Addendum)
You are scheduled for an outpatient infusion of Remdesivir at 1130AM on Sunday 1/17 and Monday 1/18.Marland Kitchen  Please report to [facility] at [address].  Drive to the security guard and tell them you are here for an infusion. They will direct you to the front entrance where we will come and get you.  For questions call [phone].  Thanks   Date of Positive COVID Test: 10/16/19  Date Quarantine Ends: 11/06/19     COVID-19 COVID-19 is a respiratory infection that is caused by a virus called severe acute respiratory syndrome coronavirus 2 (SARS-CoV-2). The disease is also known as coronavirus disease or novel coronavirus. In some people, the virus may not cause any symptoms. In others, it may cause a serious infection. The infection can get worse quickly and can lead to complications, such as:  Pneumonia, or infection of the lungs.  Acute respiratory distress syndrome or ARDS. This is a condition in which fluid build-up in the lungs prevents the lungs from filling with air and passing oxygen into the blood.  Acute respiratory failure. This is a condition in which there is not enough oxygen passing from the lungs to the body or when carbon dioxide is not passing from the lungs out of the body.  Sepsis or septic shock. This is a serious bodily reaction to an infection.  Blood clotting problems.  Secondary infections due to bacteria or fungus.  Organ failure. This is when your body's organs stop working. The virus that causes COVID-19 is contagious. This means that it can spread from person to person through droplets from coughs and sneezes (respiratory secretions). What are the causes? This illness is caused by a virus. You may catch the virus by:  Breathing in droplets from an infected person. Droplets can be spread by a person breathing, speaking, singing, coughing, or sneezing.  Touching something, like a table or a doorknob, that was exposed to the virus (contaminated) and  then touching your mouth, nose, or eyes. What increases the risk? Risk for infection You are more likely to be infected with this virus if you:  Are within 6 feet (2 meters) of a person with COVID-19.  Provide care for or live with a person who is infected with COVID-19.  Spend time in crowded indoor spaces or live in shared housing. Risk for serious illness You are more likely to become seriously ill from the virus if you:  Are 64 years of age or older. The higher your age, the more you are at risk for serious illness.  Live in a nursing home or long-term care facility.  Have cancer.  Have a long-term (chronic) disease such as: ? Chronic lung disease, including chronic obstructive pulmonary disease or asthma. ? A long-term disease that lowers your body's ability to fight infection (immunocompromised). ? Heart disease, including heart failure, a condition in which the arteries that lead to the heart become narrow or blocked (coronary artery disease), a disease which makes the heart muscle thick, weak, or stiff (cardiomyopathy). ? Diabetes. ? Chronic kidney disease. ? Sickle cell disease, a condition in which red blood cells have an abnormal "sickle" shape. ? Liver disease.  Are obese. What are the signs or symptoms? Symptoms of this condition can range from mild to severe. Symptoms may appear any time from 2 to 14 days after being exposed to the virus. They include:  A fever or chills.  A cough.  Difficulty breathing.  Headaches, body aches, or muscle aches.  Runny or stuffy (congested) nose.  A sore throat.  New loss of taste or smell. Some people may also have stomach problems, such as nausea, vomiting, or diarrhea. Other people may not have any symptoms of COVID-19. How is this diagnosed? This condition may be diagnosed based on:  Your signs and symptoms, especially if: ? You live in an area with a COVID-19 outbreak. ? You recently traveled to or from an area  where the virus is common. ? You provide care for or live with a person who was diagnosed with COVID-19. ? You were exposed to a person who was diagnosed with COVID-19.  A physical exam.  Lab tests, which may include: ? Taking a sample of fluid from the back of your nose and throat (nasopharyngeal fluid), your nose, or your throat using a swab. ? A sample of mucus from your lungs (sputum). ? Blood tests.  Imaging tests, which may include, X-rays, CT scan, or ultrasound. How is this treated? At present, there is no medicine to treat COVID-19. Medicines that treat other diseases are being used on a trial basis to see if they are effective against COVID-19. Your health care provider will talk with you about ways to treat your symptoms. For most people, the infection is mild and can be managed at home with rest, fluids, and over-the-counter medicines. Treatment for a serious infection usually takes places in a hospital intensive care unit (ICU). It may include one or more of the following treatments. These treatments are given until your symptoms improve.  Receiving fluids and medicines through an IV.  Supplemental oxygen. Extra oxygen is given through a tube in the nose, a face mask, or a hood.  Positioning you to lie on your stomach (prone position). This makes it easier for oxygen to get into the lungs.  Continuous positive airway pressure (CPAP) or bi-level positive airway pressure (BPAP) machine. This treatment uses mild air pressure to keep the airways open. A tube that is connected to a motor delivers oxygen to the body.  Ventilator. This treatment moves air into and out of the lungs by using a tube that is placed in your windpipe.  Tracheostomy. This is a procedure to create a hole in the neck so that a breathing tube can be inserted.  Extracorporeal membrane oxygenation (ECMO). This procedure gives the lungs a chance to recover by taking over the functions of the heart and lungs. It  supplies oxygen to the body and removes carbon dioxide. Follow these instructions at home: Lifestyle  If you are sick, stay home except to get medical care. Your health care provider will tell you how long to stay home. Call your health care provider before you go for medical care.  Rest at home as told by your health care provider.  Do not use any products that contain nicotine or tobacco, such as cigarettes, e-cigarettes, and chewing tobacco. If you need help quitting, ask your health care provider.  Return to your normal activities as told by your health care provider. Ask your health care provider what activities are safe for you. General instructions  Take over-the-counter and prescription medicines only as told by your health care provider.  Drink enough fluid to keep your urine pale yellow.  Keep all follow-up visits as told by your health care provider. This is important. How is this prevented?  There is no vaccine to help prevent COVID-19 infection. However, there are steps you can take to protect yourself and others from this  virus. To protect yourself:   Do not travel to areas where COVID-19 is a risk. The areas where COVID-19 is reported change often. To identify high-risk areas and travel restrictions, check the CDC travel website: FatFares.com.br  If you live in, or must travel to, an area where COVID-19 is a risk, take precautions to avoid infection. ? Stay away from people who are sick. ? Wash your hands often with soap and water for 20 seconds. If soap and water are not available, use an alcohol-based hand sanitizer. ? Avoid touching your mouth, face, eyes, or nose. ? Avoid going out in public, follow guidance from your state and local health authorities. ? If you must go out in public, wear a cloth face covering or face mask. Make sure your mask covers your nose and mouth. ? Avoid crowded indoor spaces. Stay at least 6 feet (2 meters) away from  others. ? Disinfect objects and surfaces that are frequently touched every day. This may include:  Counters and tables.  Doorknobs and light switches.  Sinks and faucets.  Electronics, such as phones, remote controls, keyboards, computers, and tablets. To protect others: If you have symptoms of COVID-19, take steps to prevent the virus from spreading to others.  If you think you have a COVID-19 infection, contact your health care provider right away. Tell your health care team that you think you may have a COVID-19 infection.  Stay home. Leave your house only to seek medical care. Do not use public transport.  Do not travel while you are sick.  Wash your hands often with soap and water for 20 seconds. If soap and water are not available, use alcohol-based hand sanitizer.  Stay away from other members of your household. Let healthy household members care for children and pets, if possible. If you have to care for children or pets, wash your hands often and wear a mask. If possible, stay in your own room, separate from others. Use a different bathroom.  Make sure that all people in your household wash their hands well and often.  Cough or sneeze into a tissue or your sleeve or elbow. Do not cough or sneeze into your hand or into the air.  Wear a cloth face covering or face mask. Make sure your mask covers your nose and mouth. Where to find more information  Centers for Disease Control and Prevention: PurpleGadgets.be  World Health Organization: https://www.castaneda.info/ Contact a health care provider if:  You live in or have traveled to an area where COVID-19 is a risk and you have symptoms of the infection.  You have had contact with someone who has COVID-19 and you have symptoms of the infection. Get help right away if:  You have trouble breathing.  You have pain or pressure in your chest.  You have confusion.  You have bluish lips  and fingernails.  You have difficulty waking from sleep.  You have symptoms that get worse. These symptoms may represent a serious problem that is an emergency. Do not wait to see if the symptoms will go away. Get medical help right away. Call your local emergency services (911 in the [place] Do not drive yourself to the hospital. Let the emergency medical personnel know if you think you have COVID-19. Summary  COVID-19 is a respiratory infection that is caused by a virus. It is also known as coronavirus disease or novel coronavirus. It can cause serious infections, such as pneumonia, acute respiratory distress syndrome, acute respiratory failure, or sepsis.  The virus that causes COVID-19 is contagious. This means that it can spread from person to person through droplets from breathing, speaking, singing, coughing, or sneezing.  You are more likely to develop a serious illness if you are 27 years of age or older, have a weak immune system, live in a nursing home, or have chronic disease.  There is no medicine to treat COVID-19. Your health care provider will talk with you about ways to treat your symptoms.  Take steps to protect yourself and others from infection. Wash your hands often and disinfect objects and surfaces that are frequently touched every day. Stay away from people who are sick and wear a mask if you are sick. This information is not intended to replace advice given to you by your health care provider. Make sure you discuss any questions you have with your health care provider. Document Revised: 07/18/2019 Document Reviewed: 10/24/2018 Elsevier Patient Education  2020 [employer].   COVID-19 Frequently Asked Questions COVID-19 (coronavirus disease) is an infection that is caused by a large family of viruses. Some viruses cause illness in people and others cause illness in animals like camels, cats, and bats. In some cases, the viruses that cause illness in animals can spread  to humans. Where did the coronavirus come from? In December 2019, [place] told the [facility]) of several cases of lung disease (human respiratory illness). These cases were linked to an open seafood and livestock market in the city of [place]. The link to the seafood and livestock market suggests that the virus may have spread from animals to humans. However, since that first outbreak in December, the virus has also been shown to spread from person to person. What is the name of the disease and the virus? Disease name Early on, this disease was called novel coronavirus. This is because scientists determined that the disease was caused by a new (novel) respiratory virus. The World Health Organization [facility]) has now named the disease COVID-19, or coronavirus disease. Virus name The virus that causes the disease is called severe acute respiratory syndrome coronavirus 2 (SARS-CoV-2). More information on disease and virus naming World Health Organization [facility]): www.who.int/emergencies/diseases/novel-coronavirus-2019/technical-guidance/naming-the-coronavirus-disease-(covid-2019)-and-the-virus-that-causes-it Who is at risk for complications from coronavirus disease? Some people may be at higher risk for complications from coronavirus disease. This includes older adults and people who have chronic diseases, such as heart disease, diabetes, and lung disease. If you are at higher risk for complications, take these extra precautions:  Stay home as much as possible.  Avoid social gatherings and travel.  Avoid close contact with others. Stay at least 6 ft (2 m) away from others, if possible.  Wash your hands often with soap and water for at least 20 seconds.  Avoid touching your face, mouth, nose, or eyes.  Keep supplies on hand at home, such as food, medicine, and cleaning supplies.  If you must go out in public, wear a cloth face covering or face mask. Make sure your mask covers your  nose and mouth. How does coronavirus disease spread? The virus that causes coronavirus disease spreads easily from person to person (is contagious). You may catch the virus by:  Breathing in droplets from an infected person. Droplets can be spread by a person breathing, speaking, singing, coughing, or sneezing.  Touching something, like a table or a doorknob, that was exposed to the virus (contaminated) and then touching your mouth, nose, or eyes. Can I get the virus from touching surfaces or objects? There  is still a lot that we do not know about the virus that causes coronavirus disease. Scientists are basing a lot of information on what they know about similar viruses, such as:  Viruses cannot generally survive on surfaces for long. They need a human body (host) to survive.  It is more likely that the virus is spread by close contact with people who are sick (direct contact), such as through: ? Shaking hands or hugging. ? Breathing in respiratory droplets that travel through the air. Droplets can be spread by a person breathing, speaking, singing, coughing, or sneezing.  It is less likely that the virus is spread when a person touches a surface or object that has the virus on it (indirect contact). The virus may be able to enter the body if the person touches a surface or object and then touches his or her face, eyes, nose, or mouth. Can a person spread the virus without having symptoms of the disease? It may be possible for the virus to spread before a person has symptoms of the disease, but this is most likely not the main way the virus is spreading. It is more likely for the virus to spread by being in close contact with people who are sick and breathing in the respiratory droplets spread by a person breathing, speaking, singing, coughing, or sneezing. What are the symptoms of coronavirus disease? Symptoms vary from person to person and can range from mild to severe. Symptoms may  include:  Fever or chills.  Cough.  Difficulty breathing or feeling short of breath.  Headaches, body aches, or muscle aches.  Runny or stuffy (congested) nose.  Sore throat.  New loss of taste or smell.  Nausea, vomiting, or diarrhea. These symptoms can appear anywhere from 2 to 14 days after you have been exposed to the virus. Some people may not have any symptoms. If you develop symptoms, call your health care provider. People with severe symptoms may need hospital care. Should I be tested for this virus? Your health care provider will decide whether to test you based on your symptoms, history of exposure, and your risk factors. How does a health care provider test for this virus? Health care providers will collect samples to send for testing. Samples may include:  Taking a swab of fluid from the back of your nose and throat, your nose, or your throat.  Taking fluid from the lungs by having you cough up mucus (sputum) into a sterile cup.  Taking a blood sample. Is there a treatment or vaccine for this virus? Currently, there is no vaccine to prevent coronavirus disease. Also, there are no medicines like antibiotics or antivirals to treat the virus. A person who becomes sick is given supportive care, which means rest and fluids. A person may also relieve his or her symptoms by using over-the-counter medicines that treat sneezing, coughing, and runny nose. These are the same medicines that a person takes for the common cold. If you develop symptoms, call your health care provider. People with severe symptoms may need hospital care. What can I do to protect myself and my family from this virus?     You can protect yourself and your family by taking the same actions that you would take to prevent the spread of other viruses. Take the following actions:  Wash your hands often with soap and water for at least 20 seconds. If soap and water are not available, use alcohol-based hand  sanitizer.  Avoid touching your  face, mouth, nose, or eyes.  Cough or sneeze into a tissue, sleeve, or elbow. Do not cough or sneeze into your hand or the air. ? If you cough or sneeze into a tissue, throw it away immediately and wash your hands.  Disinfect objects and surfaces that you frequently touch every day.  Stay away from people who are sick.  Avoid going out in public, follow guidance from your state and local health authorities.  Avoid crowded indoor spaces. Stay at least 6 ft (2 m) away from others.  If you must go out in public, wear a cloth face covering or face mask. Make sure your mask covers your nose and mouth.  Stay home if you are sick, except to get medical care. Call your health care provider before you get medical care. Your health care provider will tell you how long to stay home.  Make sure your vaccines are up to date. Ask your health care provider what vaccines you need. What should I do if I need to travel? Follow travel recommendations from your local health authority, the CDC, and WHO. Travel information and advice  Centers for Disease Control and Prevention (CDC): BodyEditor.hu  World Health Organization [facility]): ThirdIncome.ca Know the risks and take action to protect your health  You are at higher risk of getting coronavirus disease if you are traveling to areas with an outbreak or if you are exposed to travelers from areas with an outbreak.  Wash your hands often and practice good hygiene to lower the risk of catching or spreading the virus. What should I do if I am sick? General instructions to stop the spread of infection  Wash your hands often with soap and water for at least 20 seconds. If soap and water are not available, use alcohol-based hand sanitizer.  Cough or sneeze into a tissue, sleeve, or elbow. Do not cough or sneeze into your hand or the  air.  If you cough or sneeze into a tissue, throw it away immediately and wash your hands.  Stay home unless you must get medical care. Call your health care provider or local health authority before you get medical care.  Avoid public areas. Do not take public transportation, if possible.  If you can, wear a mask if you must go out of the house or if you are in close contact with someone who is not sick. Make sure your mask covers your nose and mouth. Keep your home clean  Disinfect objects and surfaces that are frequently touched every day. This may include: ? Counters and tables. ? Doorknobs and light switches. ? Sinks and faucets. ? Electronics such as phones, remote controls, keyboards, computers, and tablets.  Wash dishes in hot, soapy water or use a dishwasher. Air-dry your dishes.  Wash laundry in hot water. Prevent infecting other household members  Let healthy household members care for children and pets, if possible. If you have to care for children or pets, wash your hands often and wear a mask.  Sleep in a different bedroom or bed, if possible.  Do not share personal items, such as razors, toothbrushes, deodorant, combs, brushes, towels, and washcloths. Where to find more information Centers for Disease Control and Prevention (CDC)  Information and news updates: https://www.butler-gonzalez.com/ World Health Organization [facility])  Information and news updates: MissExecutive.com.ee  Coronavirus health topic: https://www.castaneda.info/  Questions and answers on COVID-19: OpportunityDebt.at  Global tracker: who.sprinklr.com American Academy of Pediatrics (AAP)  Information for families: www.healthychildren.org/English/health-issues/conditions/chest-lungs/Pages/2019-Novel-Coronavirus.aspx The coronavirus situation is  changing rapidly. Check your local health authority website or the CDC and  [facility] websites for updates and news. When should I contact a health care provider?  Contact your health care provider if you have symptoms of an infection, such as fever or cough, and you: ? Have been near anyone who is known to have coronavirus disease. ? Have come into contact with a person who is suspected to have coronavirus disease. ? Have traveled to an area where there is an outbreak of COVID-19. When should I get emergency medical care?  Get help right away by calling your local emergency services (911 in the [place] if you have: ? Trouble breathing. ? Pain or pressure in your chest. ? Confusion. ? Blue-tinged lips and fingernails. ? Difficulty waking from sleep. ? Symptoms that get worse. Let the emergency medical personnel know if you think you have coronavirus disease. Summary  A new respiratory virus is spreading from person to person and causing COVID-19 (coronavirus disease).  The virus that causes COVID-19 appears to spread easily. It spreads from one person to another through droplets from breathing, speaking, singing, coughing, or sneezing.  Older adults and those with chronic diseases are at higher risk of disease. If you are at higher risk for complications, take extra precautions.  There is currently no vaccine to prevent coronavirus disease. There are no medicines, such as antibiotics or antivirals, to treat the virus.  You can protect yourself and your family by washing your hands often, avoiding touching your face, and covering your coughs and sneezes. This information is not intended to replace advice given to you by your health care provider. Make sure you discuss any questions you have with your health care provider. Document Revised: 07/18/2019 Document Reviewed: 01/14/2019 Elsevier Patient Education  [place].  COVID-19: How to Protect Yourself and Others Know how it spreads  There is currently no vaccine to prevent coronavirus disease 2019  (COVID-19).  The best way to prevent illness is to avoid being exposed to this virus.  The virus is thought to spread mainly from person-to-person. ? Between people who are in close contact with one another (within about 6 feet). ? Through respiratory droplets produced when an infected person coughs, sneezes or talks. ? These droplets can land in the mouths or noses of people who are nearby or possibly be inhaled into the lungs. ? COVID-19 may be spread by people who are not showing symptoms. Everyone should Clean your hands often  Wash your hands often with soap and water for at least 20 seconds especially after you have been in a public place, or after blowing your nose, coughing, or sneezing.  If soap and water are not readily available, use a hand sanitizer that contains at least 60% alcohol. Cover all surfaces of your hands and rub them together until they feel dry.  Avoid touching your eyes, nose, and mouth with unwashed hands. Avoid close contact  Limit contact with others as much as possible.  Avoid close contact with people who are sick.  Put distance between yourself and other people. ? Remember that some people without symptoms may be able to spread virus. ? This is especially important for people who are at higher risk of getting very GainPain.com.cy Cover your mouth and nose with a mask when around others  You could spread COVID-19 to others even if you do not feel sick.  Everyone should wear a mask in public settings and when around people not living in  their household, especially when social distancing is difficult to maintain. ? Masks should not be placed on young children under age 54, anyone who has trouble breathing, or is unconscious, incapacitated or otherwise unable to remove the mask without assistance.  The mask is meant to protect other people in case you are infected.  Do NOT use a  facemask meant for a Dietitian.  Continue to keep about 6 feet between yourself and others. The mask is not a substitute for social distancing. Cover coughs and sneezes  Always cover your mouth and nose with a tissue when you cough or sneeze or use the inside of your elbow.  Throw used tissues in the trash.  Immediately wash your hands with soap and water for at least 20 seconds. If soap and water are not readily available, clean your hands with a hand sanitizer that contains at least 60% alcohol. Clean and disinfect  Clean AND disinfect frequently touched surfaces daily. This includes tables, doorknobs, light switches, countertops, handles, desks, phones, keyboards, toilets, faucets, and sinks. RackRewards.fr  If surfaces are dirty, clean them: Use detergent or soap and water prior to disinfection.  Then, use a household disinfectant. You can see a list of EPA-registered household disinfectants here. michellinders.com 06/04/2019 This information is not intended to replace advice given to you by your health care provider. Make sure you discuss any questions you have with your health care provider. Document Revised: 06/12/2019 Document Reviewed: 04/10/2019 Elsevier Patient Education  [place].

## 2019-10-18 NOTE — Evaluation (Signed)
Occupational Therapy Evaluation Patient Details Name: Rhonda Williamson MRN: [id number] DOB: 1966/01/04 Today's Date: 10/18/2019    History of Present Illness Pt adm with acute hypoxic respiratory failure due to covid PNA. PMH - htn, osa, obesity   Clinical Impression   This 54 y/o female presents with the above. PTA pt reports independence with ADL, iADL and functional mobility. Pt presenting with generalized fatigue, decreased activity tolerance and increased DOE with activity. Pt performing room level functional mobility without AD initially with minguard assist progressing to supervision level; completing toileting and standing grooming ADL with supervision throughout. Pt on RA with SpO2 >/=89% during activity, DOE 2/4 once returned to recliner. Issued energy conservation handout and reviewed during session as well as activity progression with return to daily ADL/iADL tasks. Pt will benefit from continued acute OT services to maximize her safety and independence with ADL ad mobility prior to return home. Reports plans to return home with spouse assist. Will follow.     Follow Up Recommendations  No OT follow up;Supervision/Assistance - 24 hour(24hr initially)    Equipment Recommendations  None recommended by OT(pt declines need for shower seat)           Precautions / Restrictions Precautions Precautions: Other (comment) Precaution Comments: watch SpO2 Restrictions Weight Bearing Restrictions: No      Mobility Bed Mobility Overal bed mobility: Independent                Transfers Overall transfer level: Independent Equipment used: None                  Balance Overall balance assessment: Mild deficits observed, not formally tested(pt intermittently using single UE support when available )                                         ADL either performed or assessed with clinical judgement   ADL Overall ADL's : Needs  assistance/impaired Eating/Feeding: Independent;Sitting   Grooming: Wash/dry hands;Wash/dry face;Supervision/safety;Standing   Upper Body Bathing: Supervision/ safety;Sitting;Standing   Lower Body Bathing: Supervison/ safety;Sit to/from stand   Upper Body Dressing : Set up;Sitting   Lower Body Dressing: Supervision/safety;Min guard;Sit to/from stand   Toilet Transfer: Supervision/safety;Min guard;Ambulation;Regular Glass blower/designer Details (indicate cue type and reason): minguard initially progressed to supervision Toileting- Clothing Manipulation and Hygiene: Supervision/safety;Sitting/lateral lean;Sit to/from stand Toileting - Clothing Manipulation Details (indicate cue type and reason): pt performing clothing management (mesh underwear and gown) without difficulty or LOB, performing pericare via lateral leans   Tub/Shower Transfer Details (indicate cue type and reason): pt reports taking a shower last PM, stood in the shower during bathing task without issue Functional mobility during ADLs: Supervision/safety;Min guard General ADL Comments: pt with general fatigue and DOE with activity, issued energy conservation handout and reviewed during session                         Pertinent Vitals/Pain Pain Assessment: No/denies pain     Hand Dominance     Extremity/Trunk Assessment Upper Extremity Assessment Upper Extremity Assessment: Overall WFL for tasks assessed   Lower Extremity Assessment Lower Extremity Assessment: Defer to PT evaluation   Cervical / Trunk Assessment Cervical / Trunk Assessment: Normal   Communication Communication Communication: No difficulties   Cognition Arousal/Alertness: Awake/alert Behavior During Therapy: WFL for tasks assessed/performed Overall Cognitive Status: Within Functional Limits for  tasks assessed                                     General Comments       Exercises Exercises: Other exercises Other  Exercises Other Exercises: reinforced use of IS and flutter valve   Shoulder Instructions      Home Living Family/patient expects to be discharged to:: Private residence Living Arrangements: Spouse/significant other Available Help at Discharge: Family;Available PRN/intermittently Type of Home: House Home Access: Level entry     Home Layout: One level     Bathroom Shower/Tub: Teacher, early years/pre: Standard     Home Equipment: None          Prior Functioning/Environment Level of Independence: Independent        Comments: works as Medical illustrator Problem List: Decreased strength;Decreased range of motion;Decreased activity tolerance;Impaired balance (sitting and/or standing);Decreased safety awareness;Decreased knowledge of use of DME or AE;Cardiopulmonary status limiting activity;Decreased knowledge of precautions      OT Treatment/Interventions: Self-care/ADL training;Therapeutic exercise;Energy conservation;DME and/or AE instruction;Therapeutic activities;Patient/family education;Balance training    OT Goals(Current goals can be found in the care plan section) Acute Rehab OT Goals Patient Stated Goal: return home OT Goal Formulation: With patient Time For Goal Achievement: 11/01/19 Potential to Achieve Goals: Good  OT Frequency: Min 2X/week   Barriers to D/C:            Co-evaluation              AM-PAC OT "6 Clicks" Daily Activity     Outcome Measure Help from another person eating meals?: None Help from another person taking care of personal grooming?: None Help from another person toileting, which includes using toliet, bedpan, or urinal?: None Help from another person bathing (including washing, rinsing, drying)?: A Little Help from another person to put on and taking off regular upper body clothing?: None Help from another person to put on and taking off regular lower body clothing?: A Little 6 Click Score: 22    End of Session Nurse Communication: Mobility status  Activity Tolerance: Patient tolerated treatment well Patient left: in chair;with call bell/phone within reach  OT Visit Diagnosis: Muscle weakness (generalized) (M62.81);Other (comment)(decreased activity tolerance)                Time: [id number] OT Time Calculation (min): 28 min Charges:  OT General Charges $OT Visit: 1 Visit OT Evaluation $OT Eval Moderate Complexity: 1 Mod OT Treatments $Self Care/Home Management : 8-22 mins  Lou Cal, OT Supplemental Rehabilitation Services Pager [phone] Office [phone]   Raymondo Band 10/18/2019, 9:45 AM

## 2019-10-18 NOTE — Discharge Summary (Signed)
DISCHARGE SUMMARY  Rhonda Williamson  MR#: [id number]  DOB:Jan 27, 1966  Date of Admission: 10/16/2019 Date of Discharge: 10/18/2019  Attending Physician:Evani Shrider Hennie Duos, MD  Patient's [id number], Vickki Muff, PA  Consults: none   Disposition: D/C home   Date of Positive COVID Test: 10/16/19  Date Quarantine Ends: 11/06/19  COVID-19 specific Treatment: Decadron 1/14 > 1/16 Remdesivir 1/14 > 1/18  Follow-up Appts: Follow-up Information    Chrismon, Vickki Muff, PA Follow up in 1 week(s).   Specialty: Family Medicine Contact information: [address] [place] [address] [phone]           Tests Needing Follow-up: -check LFTs in 1 week   Discharge Diagnoses: COVID Pneumonia Acute hypoxic respiratory failure Essential hypertension Obstructive sleep apnea Allergic rhinitis  Initial presentation: 54 year old with a history of HTN, OSA, and allergic rhinitis who presented to the [facility] ED with the acute onset of cough, chest congestion, and wheezing.  She denied fevers nausea vomiting diarrhea or shortness of breath.  In the ED her oxygen saturation was originally 90-93% on room air and dropped well below 90 on exertion.  A Covid PCR test was positive.  CXR noted nonspecific mild hazy airspace opacities at the right lung base.  IV remdesivir and steroid were initiated in the ED and the patient was admitted to [facility] then transferred to [facility].   Hospital Course:  COVID Pneumonia - acute hypoxic respiratory failure Continue remdesivir w/ last 2 doses to be provided as an outpt at the infusion center - stopping decadron as the pt is not hypoxic, nor has she consistently been - sats maintained w/ ambulation - pt feels much improved and agrees w/ d/c home  Essential hypertension continue amlodipine and benazepril   Obstructive sleep apnea Resume usual nightly CPAP upon returning home   Allergic rhinitis continue Flonase  Allergies as of 10/18/2019    Reactions   Latex Itching      Medication List    STOP taking these medications   ibuprofen 800 MG tablet Commonly known as: ADVIL     TAKE these medications   acetaminophen 325 MG tablet Commonly known as: TYLENOL Take 2 tablets (650 mg total) by mouth every 6 (six) hours as needed for mild pain or headache (fever >/= 101).   albuterol 108 (90 Base) MCG/ACT inhaler Commonly known as: VENTOLIN HFA Inhale 2 puffs into the lungs every 6 (six) hours as needed for wheezing or shortness of breath.   amLODipine 10 MG tablet Commonly known as: NORVASC TAKE 1 TABLET BY MOUTH DAILY What changed:   how much to take  how to take this  when to take this  additional instructions   benazepril 20 MG tablet Commonly known as: LOTENSIN TAKE 1 TABLET BY MOUTH DAILY What changed:   how much to take  how to take this  when to take this  additional instructions   fluticasone 50 MCG/ACT nasal spray Commonly known as: FLONASE 2 (TWO) SPRAYS IN EACH NOSTRIL AT BEDTIME What changed:   how much to take  how to take this  when to take this  additional instructions       Day of Discharge BP (!) 146/79 (BP Location: Left Arm)   Pulse 76   Temp 98.3 F (36.8 C) (Oral)   Resp 18   Ht 5\' 8"  (1.727 m)   Wt 113.4 kg   LMP 07/19/1995 (Approximate)   SpO2 95%   BMI 38.01 kg/m   Physical Exam: General: No acute  respiratory distress Lungs: Clear to auscultation bilaterally without wheezes or crackles Cardiovascular: Regular rate and rhythm without murmur gallop or rub normal S1 and S2 Abdomen: Nontender, nondistended, soft, bowel sounds positive, no rebound, no ascites, no appreciable mass Extremities: No significant cyanosis, clubbing, or edema bilateral lower extremities  Basic Metabolic Panel: Recent Labs  Lab 10/15/19 1916 10/16/19 0527 10/17/19 0130 10/18/19 0346  NA 139  --  140 138  K 3.5  --  3.9 3.8  CL 103  --  106 103  CO2 27  --  25 25  GLUCOSE 124*   --  129* 118*  BUN 9  --  12 15  CREATININE 0.60  --  0.49 0.52  CALCIUM 8.6*  --  8.6* 9.1  MG  --  2.1 2.2  --     Liver Function Tests: Recent Labs  Lab 10/17/19 0130 10/18/19 0346  AST 44* 40  ALT 41 43  ALKPHOS 53 62  BILITOT 0.4 0.3  PROT 7.4 7.6  ALBUMIN 3.6 3.6   CBC: Recent Labs  Lab 10/15/19 1916 10/17/19 0130 10/18/19 0346  WBC 4.7 3.3* 4.9  NEUTROABS  --  1.5* 2.7  HGB 12.5 12.4 12.9  HCT 38.5 38.2 41.1  MCV 89.7 91.0 91.1  PLT 218 254 347    Recent Results (from the past 240 hour(s))  Respiratory Panel by RT PCR (Flu A&B, Covid) - Nasopharyngeal Swab     Status: Abnormal   Collection Time: 10/16/19  1:32 AM   Specimen: Nasopharyngeal Swab  Result Value Ref Range Status   SARS Coronavirus 2 by RT PCR POSITIVE (A) NEGATIVE Final    Comment: RESULT CALLED TO, READ BACK BY AND VERIFIED WITH: Johnney Ou RN 0345 10/16/19 HNM (NOTE) SARS-CoV-2 target nucleic acids are DETECTED. SARS-CoV-2 RNA is generally detectable in upper respiratory specimens  during the acute phase of infection. Positive results are indicative of the presence of the identified virus, but do not rule out bacterial infection or co-infection with other pathogens not detected by the test. Clinical correlation with patient history and other diagnostic information is necessary to determine patient infection status. The expected result is Negative. Fact Sheet for Patients:  PinkCheek.be Fact Sheet for Healthcare Providers: GravelBags.it This test is not yet approved or cleared by the [place] FDA and  has been authorized for detection and/or diagnosis of SARS-CoV-2 by FDA under an Emergency Use Authorization (EUA).  This EUA will remain in effect (meaning this test can be used)  for the duration of  the COVID-19 declaration under Section 564(b)(1) of the Act, 21 U.S.C. section 360bbb-3(b)(1), unless the authorization  is terminated or revoked sooner.    Influenza A by PCR NEGATIVE NEGATIVE Final   Influenza B by PCR NEGATIVE NEGATIVE Final    Comment: (NOTE) The Xpert Xpress SARS-CoV-2/FLU/RSV assay is intended as an aid in  the diagnosis of influenza from Nasopharyngeal swab specimens and  should not be used as a sole basis for treatment. Nasal washings and  aspirates are unacceptable for Xpert Xpress SARS-CoV-2/FLU/RSV  testing. Fact Sheet for Patients: PinkCheek.be Fact Sheet for Healthcare Providers: GravelBags.it This test is not yet approved or cleared by the [place] FDA and  has been authorized for detection and/or diagnosis of SARS-CoV-2 by  FDA under an Emergency Use Authorization (EUA). This EUA will remain  in effect (meaning this test can be used) for the duration of the  Covid-19 declaration under Section 564(b)(1) of the Act, 21  U.S.C. section 360bbb-3(b)(1), unless the authorization is  terminated or revoked. Performed at [facility], [address] [place], [place] [address]   Culture, blood (Routine X 2) w Reflex to ID Panel     Status: None (Preliminary result)   Collection Time: 10/16/19  4:38 AM   Specimen: BLOOD  Result Value Ref Range Status   Specimen Description BLOOD RIGHT FA  Final   Special Requests   Final    BOTTLES DRAWN AEROBIC AND ANAEROBIC Blood Culture results may not be optimal due to an inadequate volume of blood received in culture bottles   Culture   Final    NO GROWTH 2 DAYS Performed at [facility], [address], [place], [place] [address]    Report Status PENDING  Incomplete     Time spent in discharge (includes decision making & examination of pt): 35 minutes  10/18/2019, 3:42 PM   Cherene Altes, MD [facility] Office  [phone]

## 2019-10-18 NOTE — Progress Notes (Signed)
Pt condition stable for discharge. Vitals WDL and no acute distress noted. Discharge instructions Given to patient, medications and all f/u appointments reviewed. Discussed with patient to avoid using NSAIDs, and to quarantine for 14 days. Pt verbalized understanding, and states no questions at this time. IV access maintained per MD for ongoing IV therapy. Education provided. Pt verbalized understanding. Pt ambulated with this RN to private vehicle without incident.

## 2019-10-18 NOTE — Progress Notes (Signed)
Patient scheduled for outpatient Remdesivir infusion at 1130AM on Sunday 1/17 and Monday 1/18. Please advise them to report to [facility] at [address].  Drive to the security guard and tell them you are here for an infusion. They will direct you to the front entrance where we will come and get you.  For questions call [phone].  Thanks

## 2019-10-19 ENCOUNTER — Ambulatory Visit (HOSPITAL_COMMUNITY)
Admission: RE | Admit: 2019-10-19 | Discharge: 2019-10-19 | Disposition: A | Discharge status: Home / Self Care | Payer: HRSA Program | Source: Ambulatory Visit | Attending: Pulmonary Disease | Admitting: Pulmonary Disease

## 2019-10-19 DIAGNOSIS — U071 COVID-19: Secondary | ICD-10-CM | POA: Diagnosis not present

## 2019-10-19 MED ORDER — SODIUM CHLORIDE 0.9 % IV SOLN: INTRAVENOUS | Status: DC | PRN

## 2019-10-19 MED ORDER — EPINEPHRINE 0.3 MG/0.3ML IJ SOAJ: 0.3000 mg | Freq: Once | INTRAMUSCULAR | Status: DC | PRN

## 2019-10-19 MED ORDER — ALBUTEROL SULFATE HFA 108 (90 BASE) MCG/ACT IN AERS: 2.0000 | INHALATION_SPRAY | Freq: Once | RESPIRATORY_TRACT | Status: DC | PRN

## 2019-10-19 MED ORDER — SODIUM CHLORIDE 0.9 % IV SOLN: 100.0000 mg | Freq: Once | INTRAVENOUS | Status: DC

## 2019-10-19 MED ORDER — METHYLPREDNISOLONE SODIUM SUCC 125 MG IJ SOLR: 125.0000 mg | Freq: Once | INTRAMUSCULAR | Status: DC | PRN

## 2019-10-19 MED ORDER — SODIUM CHLORIDE 0.9 % IV SOLN
INTRAVENOUS | Status: AC
  Filled 2019-10-19: qty 20

## 2019-10-19 MED ORDER — DIPHENHYDRAMINE HCL 50 MG/ML IJ SOLN: 50.0000 mg | Freq: Once | INTRAMUSCULAR | Status: DC | PRN

## 2019-10-19 MED ORDER — FAMOTIDINE IN NACL 20-0.9 MG/50ML-% IV SOLN: 20.0000 mg | Freq: Once | INTRAVENOUS | Status: DC | PRN

## 2019-10-19 NOTE — Progress Notes (Signed)
  Diagnosis: COVID-19  Physician: Dr. Joya Gaskins  Procedure: Covid Infusion Clinic Med: remdesivir infusion.  Complications: No immediate complications noted.  Discharge: Discharged home   Rhonda Williamson 10/19/2019

## 2019-10-20 ENCOUNTER — Ambulatory Visit (HOSPITAL_COMMUNITY)
Admit: 2019-10-20 | Discharge: 2019-10-20 | Disposition: A | Discharge status: Home / Self Care | Payer: HRSA Program | Attending: Pulmonary Disease | Admitting: Pulmonary Disease

## 2019-10-20 ENCOUNTER — Telehealth: Payer: Self-pay

## 2019-10-20 DIAGNOSIS — U071 COVID-19: Secondary | ICD-10-CM | POA: Diagnosis not present

## 2019-10-20 MED ORDER — SODIUM CHLORIDE 0.9 % IV SOLN
100.0000 mg | Freq: Once | INTRAVENOUS | Status: AC
  Administered 2019-10-20: 11:00:00 100 mg via INTRAVENOUS

## 2019-10-20 MED ORDER — ALBUTEROL SULFATE HFA 108 (90 BASE) MCG/ACT IN AERS: 2.0000 | INHALATION_SPRAY | Freq: Once | RESPIRATORY_TRACT | Status: DC | PRN

## 2019-10-20 MED ORDER — DIPHENHYDRAMINE HCL 50 MG/ML IJ SOLN: 50.0000 mg | Freq: Once | INTRAMUSCULAR | Status: DC | PRN

## 2019-10-20 MED ORDER — METHYLPREDNISOLONE SODIUM SUCC 125 MG IJ SOLR: 125.0000 mg | Freq: Once | INTRAMUSCULAR | Status: DC | PRN

## 2019-10-20 MED ORDER — SODIUM CHLORIDE 0.9 % IV SOLN: INTRAVENOUS | Status: DC | PRN

## 2019-10-20 MED ORDER — SODIUM CHLORIDE 0.9 % IV SOLN
INTRAVENOUS | Status: AC
  Filled 2019-10-20: qty 20

## 2019-10-20 MED ORDER — EPINEPHRINE 0.3 MG/0.3ML IJ SOAJ: 0.3000 mg | Freq: Once | INTRAMUSCULAR | Status: DC | PRN

## 2019-10-20 MED ORDER — FAMOTIDINE IN NACL 20-0.9 MG/50ML-% IV SOLN: 20.0000 mg | Freq: Once | INTRAVENOUS | Status: DC | PRN

## 2019-10-20 NOTE — Telephone Encounter (Signed)
Need to verify insurance prior to completing TCM call.

## 2019-10-20 NOTE — Telephone Encounter (Signed)
Sounds great, thank you.

## 2019-10-20 NOTE — Progress Notes (Signed)
  Diagnosis: COVID-19  Physician: Dr. Joya Gaskins   Procedure: Covid Infusion Clinic Med: remdesivir infusion.  Complications: No immediate complications noted.  Discharge: Discharged home   Rhonda Williamson 10/20/2019

## 2019-10-20 NOTE — Telephone Encounter (Signed)
Transition Care Management Follow-up Telephone Call  Date of discharge and from where: [facility] on 10/18/19  How have you been since you were released from the hospital? Doing better, went to last infusion apt today. Pt is still coughing and seeing clear sputum. Declines fever, pain, congestion, SOB or n/v/d.  Any questions or concerns? No   Items Reviewed:  Did the pt receive and understand the discharge instructions provided? Yes   Medications obtained and verified? Yes   Any new allergies since your discharge? No   Dietary orders reviewed? No  Do you have support at home? Yes   Other (ie: DME, Home Health, etc) N/A  Functional Questionnaire: (I = Independent and D = Dependent)  Bathing/Dressing- I   Meal Prep- I  Eating- I  Maintaining continence- I  Transferring/Ambulation- I  Managing Meds- I   Follow up appointments reviewed:    PCP Hospital f/u appt confirmed? Yes  Scheduled to see Carles Collet on 10/24/19 @ 2:00 PM.  [facility] f/u appt confirmed? N/A  Are transportation arrangements needed? No   If their condition worsens, is the pt aware to call  their PCP or go to the ED? Yes  Was the patient provided with contact information for the PCP's office or ED? Yes  Was the pt encouraged to call back with questions or concerns? Yes

## 2019-10-21 LAB — CULTURE, BLOOD (ROUTINE X 2): Culture: NO GROWTH

## 2019-10-24 ENCOUNTER — Ambulatory Visit (INDEPENDENT_AMBULATORY_CARE_PROVIDER_SITE_OTHER): Payer: HRSA Program | Admitting: Physician Assistant

## 2019-10-24 DIAGNOSIS — J1282 Pneumonia due to coronavirus disease 2019: Secondary | ICD-10-CM | POA: Diagnosis not present

## 2019-10-24 DIAGNOSIS — U071 COVID-19: Secondary | ICD-10-CM | POA: Diagnosis not present

## 2019-10-24 DIAGNOSIS — R05 Cough: Secondary | ICD-10-CM

## 2019-10-24 DIAGNOSIS — R059 Cough, unspecified: Secondary | ICD-10-CM

## 2019-10-24 MED ORDER — BENZONATATE 100 MG PO CAPS: 100.0000 mg | ORAL_CAPSULE | Freq: Three times a day (TID) | ORAL | 0 refills | Status: AC | PRN

## 2019-10-24 MED ORDER — PROMETHAZINE-DM 6.25-15 MG/5ML PO SYRP: 5.0000 mL | ORAL_SOLUTION | Freq: Four times a day (QID) | ORAL | 0 refills | Status: DC | PRN

## 2019-10-24 NOTE — Progress Notes (Signed)
Patient: Rhonda Williamson Female    DOB: 01-Jan-1966   54 y.o.   MRN: [id number] Visit Date: 10/24/2019  Today's Provider: Trinna Post, PA-C   Chief Complaint  Patient presents with  . Hospitalization Follow-up   Subjective:    Virtual Visit via Telephone Note  I connected with Leda Quail on 10/24/19 at  2:00 PM EST by telephone and verified that I am speaking with the correct person using two identifiers.  Location: Patient: Home Provider: Office    I discussed the limitations, risks, security and privacy concerns of performing an evaluation and management service by telephone and the availability of in person appointments. I also discussed with the patient that there may be a patient responsible charge related to this service. The patient expressed understanding and agreed to proceed.  HPI  Follow up Hospitalization  Patient was admitted to [facility] on 10/16/2019 and discharged on 10/18/2019. She was treated for pneumonia due to COVID-19 virus. Treatment for this included EKG, labs, Rocephin and Zithromax. Telephone follow up was done on 10/20/2019 w/ McKenzie. She reports good compliance with treatment. She reports this condition is Improved. Patient states that she still have a cough.  Patient reports last Wednesday she felt very poorly and fatigued as well as SOB. On Wednesday she was experiencing worsening SOB and was found to have O2 sats in the 80s on exertion. She was positive for COVID and COVID pneumonia.   She was discharged on 10/18/2019 and reports her first day back was slightly worse but has been steadily improving. She has a cough still. Quarantine ends on 11/06/2019. She is not having fevers or SOB.  ------------------------------------------------------------------------------------      Allergies  Allergen Reactions  . Latex Itching     Current Outpatient Medications:  .  acetaminophen (TYLENOL) 325 MG tablet, Take 2 tablets  (650 mg total) by mouth every 6 (six) hours as needed for mild pain or headache (fever >/= 101)., Disp:  , Rfl:  .  albuterol (VENTOLIN HFA) 108 (90 Base) MCG/ACT inhaler, Inhale 2 puffs into the lungs every 6 (six) hours as needed for wheezing or shortness of breath., Disp: 8 g, Rfl: 3 .  amLODipine (NORVASC) 10 MG tablet, TAKE 1 TABLET BY MOUTH DAILY (Patient taking differently: Take 10 mg by mouth daily. ), Disp: 90 tablet, Rfl: 0 .  benazepril (LOTENSIN) 20 MG tablet, TAKE 1 TABLET BY MOUTH DAILY (Patient taking differently: Take 20 mg by mouth daily. ), Disp: 90 tablet, Rfl: 0 .  fluticasone (FLONASE) 50 MCG/ACT nasal spray, 2 (TWO) SPRAYS IN EACH NOSTRIL AT BEDTIME (Patient taking differently: Place 2 sprays into both nostrils at bedtime. ), Disp: 16 g, Rfl: 2  Review of Systems  Constitutional: Negative.   HENT: Negative.   Respiratory: Negative.   Gastrointestinal: Negative.   Neurological: Negative.     Social History   Tobacco Use  . Smoking status: Former Research scientist (life sciences)  . Smokeless tobacco: Never Used  . Tobacco comment: QUIT IN 2004  Substance Use Topics  . Alcohol use: No    Alcohol/week: 0.0 standard drinks      Objective:   LMP 07/19/1995 (Approximate)  There were no vitals filed for this visit.There is no height or weight on file to calculate BMI.   Physical Exam Pulmonary:     Comments: Patient talking in complete sentences without pause.      No results found for any visits on 10/24/19.  Assessment & Plan    1. COVID-19  Advised she will likely experience steady improvement. Advised on end of quarantine 11/06/2019, work note provided. Cough medication as below.   - promethazine-dextromethorphan (PROMETHAZINE-DM) 6.25-15 MG/5ML syrup; Take 5 mLs by mouth 4 (four) times daily as needed.  Dispense: 118 mL; Refill: 0 - DG Chest 2 View; Future  2. Pneumonia due to COVID-19 virus  - DG Chest 2 View; Future  3. Cough  - benzonatate (TESSALON PERLES) 100 MG  capsule; Take 1 capsule (100 mg total) by mouth 3 (three) times daily as needed for up to 7 days.  Dispense: 21 capsule; Refill: 0 - promethazine-dextromethorphan (PROMETHAZINE-DM) 6.25-15 MG/5ML syrup; Take 5 mLs by mouth 4 (four) times daily as needed.  Dispense: 118 mL; Refill: 0 - DG Chest 2 View; Future I discussed the assessment and treatment plan with the patient. The patient was provided an opportunity to ask questions and all were answered. The patient agreed with the plan and demonstrated an understanding of the instructions.   The patient was advised to call back or seek an in-person evaluation if the symptoms worsen or if the condition fails to improve as anticipated.  The entirety of the information documented in the History of Present Illness, Review of Systems and Physical Exam were personally obtained by me. Portions of this information were initially documented by [facility] and reviewed by me for thoroughness and accuracy.   Trinna Post, PA-C  [facility]

## 2019-10-28 ENCOUNTER — Telehealth: Payer: Self-pay

## 2019-10-28 NOTE — Telephone Encounter (Signed)
Patient call was returned and patient states that she was calling on behalf of her husband. Patient schedule Mr.Furnish appointment to see Fabio Bering tomorrow 10/29/2019 @ 8:40 AM.

## 2019-10-28 NOTE — Telephone Encounter (Signed)
Pt called back in to follow up on request for call back from provider. Verified that message was sent.

## 2019-10-28 NOTE — Telephone Encounter (Signed)
Copied from [place] [id number]. Topic: General - Other >> Oct 28, 2019  1:10 PM Sheran Luz wrote: Patient requesting to speak with Carles Collet. She states this is regarding last appointment.

## 2019-11-07 ENCOUNTER — Encounter: Payer: Self-pay | Admitting: Physician Assistant

## 2019-11-14 ENCOUNTER — Other Ambulatory Visit: Payer: Self-pay | Admitting: Family Medicine

## 2019-11-14 DIAGNOSIS — I10 Essential (primary) hypertension: Secondary | ICD-10-CM

## 2019-11-14 MED ORDER — AMLODIPINE BESYLATE 10 MG PO TABS: ORAL_TABLET | ORAL | 0 refills | Status: DC

## 2019-11-14 NOTE — Telephone Encounter (Signed)
Requested Prescriptions  Pending Prescriptions Disp Refills  . amLODipine (NORVASC) 10 MG tablet 90 tablet 0    Sig: TAKE 1 TABLET BY MOUTH DAILY     Cardiovascular:  Calcium Channel Blockers Passed - 11/14/2019 11:23 AM      Passed - Last BP in normal range    BP Readings from Last 1 Encounters:  10/20/19 135/77         Passed - Valid encounter within last 6 months    Recent Outpatient Visits          3 weeks ago [place], [place], PA-C   2 months ago Essential (primary) hypertension   [facility], [place], [place]   1 year ago Elevated fasting glucose   [facility], [place], [place]   1 year ago Acute pansinusitis, recurrence not specified   [facility], [place], [place]   3 years ago Annual physical exam   [place], [place]      Future Appointments            In 3 months [place], Vickki Muff, [place], [facility]           Signed Prescriptions Disp Refills   benazepril (LOTENSIN) 20 MG tablet 90 tablet 0    Sig: TAKE 1 TABLET BY MOUTH DAILY     Cardiovascular:  ACE Inhibitors Passed - 11/14/2019 11:15 AM      Passed - Cr in normal range and within 180 days    Creatinine, Ser  Date Value Ref Range Status  10/18/2019 0.52 0.44 - 1.00 mg/dL Final         Passed - K in normal range and within 180 days    Potassium  Date Value Ref Range Status  10/18/2019 3.8 3.5 - 5.1 mmol/L Final         Passed - Patient is not pregnant      Passed - Last BP in normal range    BP Readings from Last 1 Encounters:  10/20/19 135/77         Passed - Valid encounter within last 6 months    Recent Outpatient Visits          3 weeks ago [place], [place], PA-C   2 months ago Essential (primary) hypertension   [facility], Vickki Muff, [place]   1 year ago Elevated fasting glucose   [facility], [place], [place]   1 year ago Acute pansinusitis, recurrence not specified   [facility], Vickki Muff, [place]   3 years ago Annual physical exam   [facility], Vickki Muff, [place]      Future Appointments            In 3 months [place], Vickki Muff, [place], [place]

## 2019-11-14 NOTE — Telephone Encounter (Signed)
Patient called in stating pharmacy was also supposed to request amLODipine (NORVASC) 10 MG tablet. Please advise.

## 2019-12-16 ENCOUNTER — Other Ambulatory Visit: Payer: Self-pay

## 2019-12-16 NOTE — Progress Notes (Signed)
Patient pre-screened for BCCCP eligibility due to COVID 19 precautions. Two patient identifiers used for verification that I was speaking to correct patient.  Patient to present directly to [facility] on 12/17/19 at 3:00 for clinical breast exam.  Afterwards she will go to [facility]  for BCCCP screening mammogram.

## 2019-12-17 ENCOUNTER — Ambulatory Visit: Payer: Self-pay | Attending: Obstetrics and Gynecology

## 2019-12-17 ENCOUNTER — Other Ambulatory Visit: Payer: Self-pay

## 2019-12-17 ENCOUNTER — Ambulatory Visit
Admission: RE | Admit: 2019-12-17 | Discharge: 2019-12-17 | Disposition: A | Discharge status: Home / Self Care | Payer: Self-pay | Source: Ambulatory Visit | Attending: Oncology | Admitting: Oncology

## 2019-12-17 VITALS — BP 163/90 | HR 94 | Temp 97.3°F | Ht 68.5 in | Wt 257.0 lb

## 2019-12-17 DIAGNOSIS — Z0000 Encounter for general adult medical examination without abnormal findings: Secondary | ICD-10-CM | POA: Insufficient documentation

## 2019-12-17 NOTE — Progress Notes (Signed)
  Subjective:     Patient ID: Rhonda Williamson, female   DOB: Aug 09, 1966, 54 y.o.   MRN: [id number]  HPI   Review of Systems     Objective:   Physical Exam     Assessment:     54 year old patient presents for [facility] clinic visit.  Patient screened, and meets BCCCP eligibility.  Patient does not have insurance, Medicare or Medicaid. Instructed patient on breast self awareness using teach back method.  Clinical breast exam unremarkable. No mass or lump palpated.  Risk Assessment    Risk Scores      12/17/2019 09/23/2018   Last edited by: Theodore Demark, RN Theodore Demark, RN   5-year risk: 1.3 % 1.3 %   Lifetime risk: 8 % 8.2 %            Plan:     Sent for bilateral screening mammogram.

## 2019-12-22 NOTE — Progress Notes (Signed)
Letter mailed from [facility] to notify of normal mammogram results.  Patient to return in one year for annual screening.  Copy to [facility]. 

## 2020-02-15 ENCOUNTER — Other Ambulatory Visit: Payer: Self-pay | Admitting: Family Medicine

## 2020-02-15 DIAGNOSIS — I10 Essential (primary) hypertension: Secondary | ICD-10-CM

## 2020-02-15 NOTE — Telephone Encounter (Signed)
Requested Prescriptions  Pending Prescriptions Disp Refills  . amLODipine (NORVASC) 10 MG tablet [Pharmacy Med Name: AMLODIPINE BESYLATE 10MG  TABLETS] 90 tablet 0    Sig: TAKE 1 TABLET BY MOUTH DAILY. NEED FOLLOW UP APPT.     Cardiovascular:  Calcium Channel Blockers Failed - 02/15/2020  3:46 PM      Failed - Last BP in normal range    BP Readings from Last 1 Encounters:  12/17/19 (!) 163/90         Passed - Valid encounter within last 6 months    Recent Outpatient Visits          3 months ago [place], [place], PA-C   5 months ago Essential (primary) hypertension   [facility], Vickki Muff, [place]   1 year ago Elevated fasting glucose   [facility], Vickki Muff, [place]   1 year ago Acute pansinusitis, recurrence not specified   [facility], Vickki Muff, [place]   3 years ago Annual physical exam   [facility], Vickki Muff, [place]      Future Appointments            In 1 week [place], Vickki Muff, [place], [facility]           . benazepril (LOTENSIN) 20 MG tablet [employer] Med Name: BENAZEPRIL 20MG  TABLETS] 90 tablet 0    Sig: TAKE 1 TABLET BY MOUTH DAILY     Cardiovascular:  ACE Inhibitors Failed - 02/15/2020  3:46 PM      Failed - Last BP in normal range    BP Readings from Last 1 Encounters:  12/17/19 (!) 163/90         Passed - Cr in normal range and within 180 days    Creatinine, Ser  Date Value Ref Range Status  10/18/2019 0.52 0.44 - 1.00 mg/dL Final         Passed - K in normal range and within 180 days    Potassium  Date Value Ref Range Status  10/18/2019 3.8 3.5 - 5.1 mmol/L Final         Passed - Patient is not pregnant      Passed - Valid encounter within last 6 months    Recent Outpatient Visits          3 months ago [place], [place], PA-C   5 months ago Essential (primary) hypertension    [facility], Vickki Muff, [place]   1 year ago Elevated fasting glucose   [facility], [place], [place]   1 year ago Acute pansinusitis, recurrence not specified   [facility], Vickki Muff, [place]   3 years ago Annual physical exam   [facility], Vickki Muff, [place]      Future Appointments            In 1 week [place], Vickki Muff, [place], [place]

## 2020-02-25 NOTE — Progress Notes (Signed)
Established patient visit   Patient: Rhonda Williamson   DOB: [birth year]   54 y.o. Female  MRN: [id number] Visit Date: 02/26/2020  Today's healthcare provider: Vernie Murders, PA   Chief Complaint  Patient presents with   Hypertension   Subjective    HPI Hypertension, follow-up  BP Readings from Last 3 Encounters:  02/26/20 137/82  12/17/19 (!) 163/90  10/20/19 135/77   Wt Readings from Last 3 Encounters:  02/26/20 262 lb (118.8 kg)  12/17/19 257 lb (116.6 kg)  10/16/19 250 lb (113.4 kg)     She was last seen for hypertension 6 months ago.  BP at that visit was 157/87. Management since that visit includes continuing same medication.  She reports good compliance with treatment. She is not having side effects.  She is following a Low Sodium diet. She is not exercising. She does not smoke.  Use of agents associated with hypertension: none.   Outside blood pressures are not checked. Symptoms: No chest pain No chest pressure  No palpitations No syncope  No dyspnea No orthopnea  No paroxysmal nocturnal dyspnea No lower extremity edema   Pertinent labs: No results found for: CHOL, HDL, LDLCALC, LDLDIRECT, TRIG, CHOLHDL Lab Results  Component Value Date   NA 138 10/18/2019   K 3.8 10/18/2019   CREATININE 0.52 10/18/2019   GFRNONAA >60 10/18/2019   GFRAA >60 10/18/2019   GLUCOSE 118 (H) 10/18/2019     The ASCVD Risk score (Goff DC Jr., et al., 2013) failed to calculate for the following reasons:   Cannot find a previous HDL lab   Cannot find a previous total cholesterol lab   ---------------------------------------------------------------------------------------------------  Patient Active Problem List   Diagnosis Date Noted   COVID-19 10/16/2019   Pneumonia due to COVID-19 virus 10/16/2019   CN (constipation) 05/11/2015   LBP (low back pain) 05/11/2015   Obstructive apnea 05/11/2015   Mucositis oral 05/11/2015   Family history of diabetes  mellitus 03/29/2010   Awareness of heartbeats 03/29/2010   Arthropathia 02/27/2009   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 Past Surgical History:  Procedure Laterality Date   ABDOMINAL HYSTERECTOMY  1998   BUNIONECTOMY  2005   CESAREAN SECTION  1993   TUBAL LIGATION  1993   Social History   Tobacco Use   Smoking status: Former Smoker   Smokeless tobacco: Never Used   Tobacco comment: QUIT IN 2004  Substance Use Topics   Alcohol use: No    Alcohol/week: 0.0 standard drinks   Drug use: No   Family History  Problem Relation Age of Onset   Ovarian cancer Mother    Lung cancer Father    Lung cancer Sister    Bone cancer Sister    CVA Sister    Emphysema Brother    Hypertension Sister    Breast cancer Neg Hx    Allergies  Allergen Reactions   Latex Itching       Medications: Outpatient Medications Prior to Visit  Medication Sig   acetaminophen (TYLENOL) 325 MG tablet Take 2 tablets (650 mg total) by mouth every 6 (six) hours as needed for mild pain or headache (fever >/= 101).   albuterol (VENTOLIN HFA) 108 (90 Base) MCG/ACT inhaler Inhale 2 puffs into the lungs every 6 (six) hours as needed for wheezing or shortness of breath.  amLODipine (NORVASC) 10 MG tablet TAKE 1 TABLET BY MOUTH DAILY. NEED FOLLOW UP APPT.   benazepril (LOTENSIN) 20 MG tablet TAKE 1 TABLET BY MOUTH DAILY   fluticasone (FLONASE) 50 MCG/ACT nasal spray 2 (TWO) SPRAYS IN EACH NOSTRIL AT BEDTIME (Patient taking differently: Place 2 sprays into both nostrils at bedtime. )   promethazine-dextromethorphan (PROMETHAZINE-DM) 6.25-15 MG/5ML syrup Take 5 mLs by mouth 4 (four) times daily as needed.   No facility-administered medications prior to visit.    Review of Systems  Constitutional: Negative for appetite change, chills, fatigue  and fever.  Respiratory: Negative for chest tightness and shortness of breath.   Cardiovascular: Negative for chest pain and palpitations.  Gastrointestinal: Negative for abdominal pain, nausea and vomiting.  Neurological: Negative for dizziness and weakness.    Last CBC Lab Results  Component Value Date   WBC 4.9 10/18/2019   HGB 12.9 10/18/2019   HCT 41.1 10/18/2019   MCV 91.1 10/18/2019   MCH 28.6 10/18/2019   RDW 13.3 10/18/2019   PLT 347 0000000   Last metabolic panel Lab Results  Component Value Date   GLUCOSE 118 (H) 10/18/2019   NA 138 10/18/2019   K 3.8 10/18/2019   CL 103 10/18/2019   CO2 25 10/18/2019   BUN 15 10/18/2019   CREATININE 0.52 10/18/2019   GFRNONAA >60 10/18/2019   GFRAA >60 10/18/2019   CALCIUM 9.1 10/18/2019   PROT 7.6 10/18/2019   ALBUMIN 3.6 10/18/2019   LABGLOB 3.0 06/14/2018   AGRATIO 1.5 06/14/2018   BILITOT 0.3 10/18/2019   ALKPHOS 62 10/18/2019   AST 40 10/18/2019   ALT 43 10/18/2019   ANIONGAP 10 10/18/2019   Last lipids No results found for: CHOL, HDL, LDLCALC, LDLDIRECT, TRIG, CHOLHDL Last hemoglobin A1c Lab Results  Component Value Date   HGBA1C 5.9 (A) 08/29/2019      Objective    BP 137/82 (BP Location: Right Arm, Patient Position: Sitting, Cuff Size: Large)    Pulse 80    Temp (!) 97.5 F (36.4 C) (Temporal)    Resp 18    Wt 262 lb (118.8 kg)    LMP 07/19/1995 (Approximate)    BMI 39.26 kg/m  BP Readings from Last 3 Encounters:  02/26/20 137/82  12/17/19 (!) 163/90  10/20/19 135/77   Wt Readings from Last 3 Encounters:  02/26/20 262 lb (118.8 kg)  12/17/19 257 lb (116.6 kg)  10/16/19 250 lb (113.4 kg)     Physical Exam Constitutional:      General: She is not in acute distress.    Appearance: She is well-developed.  HENT:     Head: Normocephalic and atraumatic.     Right Ear: Hearing normal.     Left Ear: Hearing normal.     Nose: Nose normal.  Eyes:     General: Lids are normal. No scleral icterus.        Right eye: No discharge.        Left eye: No discharge.     Conjunctiva/sclera: Conjunctivae normal.  Cardiovascular:     Rate and Rhythm: Normal rate and regular rhythm.     Pulses: Normal pulses.     Heart sounds: Normal heart sounds.  Pulmonary:     Effort: Pulmonary effort is normal. No respiratory distress.     Breath sounds: Normal breath sounds.  Abdominal:     General: Bowel sounds are normal.     Palpations: Abdomen is soft.  Musculoskeletal:  General: Normal range of motion.     Cervical back: Normal range of motion.  Skin:    Findings: No lesion or rash.  Neurological:     Mental Status: She is alert and oriented to person, place, and time.  Psychiatric:        Speech: Speech normal.        Behavior: Behavior normal.        Thought Content: Thought content normal.       Assessment & Plan     1. Essential (primary) hypertension Well controlled BP. Tolerating Amlodipine 10 mg qd and Benazepril 20 mg qd without side effects. Continues to restrict salt in diet. Will get follow up labs when her insurance come in effect in the next 3-4 months. Continue present medications.  2. Obstructive apnea Continues to sleep well with use of CPAP at 12-15 cm H2O pressure each night.  3. History of COVID-19 Positive COVID-19 test on 10-16-19 and discharged from [facility] on 10-18-19 after infusion therapy. Feeling very well now without cough, shortness of breath or residual loss of taste. Back to full energy level and working full time. Will get first COVID vaccination today or tomorrow.   No follow-ups on file.      Andres Shad, PA, have reviewed all documentation for this visit. The documentation on 02/26/20 for the exam, diagnosis, procedures, and orders are all accurate and complete.    Vernie Murders, [place] [phone] (phone) [phone] (fax)  [place]

## 2020-02-26 ENCOUNTER — Encounter: Payer: Self-pay | Admitting: Family Medicine

## 2020-02-26 ENCOUNTER — Ambulatory Visit: Payer: Self-pay | Admitting: Family Medicine

## 2020-02-26 ENCOUNTER — Other Ambulatory Visit: Payer: Self-pay

## 2020-02-26 ENCOUNTER — Ambulatory Visit (INDEPENDENT_AMBULATORY_CARE_PROVIDER_SITE_OTHER): Payer: Self-pay | Admitting: Family Medicine

## 2020-02-26 VITALS — BP 137/82 | HR 80 | Temp 97.5°F | Resp 18 | Wt 262.0 lb

## 2020-02-26 DIAGNOSIS — I10 Essential (primary) hypertension: Secondary | ICD-10-CM

## 2020-02-26 DIAGNOSIS — Z8616 Personal history of COVID-19: Secondary | ICD-10-CM

## 2020-02-26 DIAGNOSIS — G4733 Obstructive sleep apnea (adult) (pediatric): Secondary | ICD-10-CM

## 2020-02-26 NOTE — Patient Instructions (Signed)
DASH Eating Plan DASH stands for "Dietary Approaches to Stop Hypertension." The DASH eating plan is a healthy eating plan that has been shown to reduce high blood pressure (hypertension). It may also reduce your risk for type 2 diabetes, heart disease, and stroke. The DASH eating plan may also help with weight loss. What are tips for following this plan?  General guidelines  Avoid eating more than 2,300 mg (milligrams) of salt (sodium) a day. If you have hypertension, you may need to reduce your sodium intake to 1,500 mg a day.  Limit alcohol intake to no more than 1 drink a day for nonpregnant women and 2 drinks a day for men. One drink equals 12 oz of beer, 5 oz of wine, or 1 oz of hard liquor.  Work with your health care provider to maintain a healthy body weight or to lose weight. Ask what an ideal weight is for you.  Get at least 30 minutes of exercise that causes your heart to beat faster (aerobic exercise) most days of the week. Activities may include walking, swimming, or biking.  Work with your health care provider or diet and nutrition specialist (dietitian) to adjust your eating plan to your individual calorie needs. Reading food labels   Check food labels for the amount of sodium per serving. Choose foods with less than 5 percent of the Daily Value of sodium. Generally, foods with less than 300 mg of sodium per serving fit into this eating plan.  To find whole grains, look for the word "whole" as the first word in the ingredient list. Shopping  Buy products labeled as "low-sodium" or "no salt added."  Buy fresh foods. Avoid canned foods and premade or frozen meals. Cooking  Avoid adding salt when cooking. Use salt-free seasonings or herbs instead of table salt or sea salt. Check with your health care provider or pharmacist before using salt substitutes.  Do not fry foods. Cook foods using healthy methods such as baking, boiling, grilling, and broiling instead.  Cook with  heart-healthy oils, such as olive, canola, soybean, or sunflower oil. Meal planning  Eat a balanced diet that includes: ? 5 or more servings of fruits and vegetables each day. At each meal, try to fill half of your plate with fruits and vegetables. ? Up to 6-8 servings of whole grains each day. ? Less than 6 oz of lean meat, poultry, or fish each day. A 3-oz serving of meat is about the same size as a deck of cards. One egg equals 1 oz. ? 2 servings of low-fat dairy each day. ? A serving of nuts, seeds, or beans 5 times each week. ? Heart-healthy fats. Healthy fats called Omega-3 fatty acids are found in foods such as flaxseeds and coldwater fish, like sardines, salmon, and mackerel.  Limit how much you eat of the following: ? Canned or prepackaged foods. ? Food that is high in trans fat, such as fried foods. ? Food that is high in saturated fat, such as fatty meat. ? Sweets, desserts, sugary drinks, and other foods with added sugar. ? Full-fat dairy products.  Do not salt foods before eating.  Try to eat at least 2 vegetarian meals each week.  Eat more home-cooked food and less restaurant, buffet, and fast food.  When eating at a restaurant, ask that your food be prepared with less salt or no salt, if possible. What foods are recommended? The items listed may not be a complete list. Talk with your dietitian about   what dietary choices are best for you. Grains Whole-grain or whole-wheat bread. Whole-grain or whole-wheat pasta. Brown rice. Oatmeal. Quinoa. Bulgur. Whole-grain and low-sodium cereals. Pita bread. Low-fat, low-sodium crackers. Whole-wheat flour tortillas. Vegetables Fresh or frozen vegetables (raw, steamed, roasted, or grilled). Low-sodium or reduced-sodium tomato and vegetable juice. Low-sodium or reduced-sodium tomato sauce and tomato paste. Low-sodium or reduced-sodium canned vegetables. Fruits All fresh, dried, or frozen fruit. Canned fruit in natural juice (without  added sugar). Meat and other protein foods Skinless chicken or turkey. Ground chicken or turkey. Pork with fat trimmed off. Fish and seafood. Egg whites. Dried beans, peas, or lentils. Unsalted nuts, nut butters, and seeds. Unsalted canned beans. Lean cuts of beef with fat trimmed off. Low-sodium, lean deli meat. Dairy Low-fat (1%) or fat-free (skim) milk. Fat-free, low-fat, or reduced-fat cheeses. Nonfat, low-sodium ricotta or cottage cheese. Low-fat or nonfat yogurt. Low-fat, low-sodium cheese. Fats and oils Soft margarine without trans fats. Vegetable oil. Low-fat, reduced-fat, or light mayonnaise and salad dressings (reduced-sodium). Canola, safflower, olive, soybean, and sunflower oils. Avocado. Seasoning and other foods Herbs. Spices. Seasoning mixes without salt. Unsalted popcorn and pretzels. Fat-free sweets. What foods are not recommended? The items listed may not be a complete list. Talk with your dietitian about what dietary choices are best for you. Grains Baked goods made with fat, such as croissants, muffins, or some breads. Dry pasta or rice meal packs. Vegetables Creamed or fried vegetables. Vegetables in a cheese sauce. Regular canned vegetables (not low-sodium or reduced-sodium). Regular canned tomato sauce and paste (not low-sodium or reduced-sodium). Regular tomato and vegetable juice (not low-sodium or reduced-sodium). Pickles. Olives. Fruits Canned fruit in a light or heavy syrup. Fried fruit. Fruit in cream or butter sauce. Meat and other protein foods Fatty cuts of meat. Ribs. Fried meat. Bacon. Sausage. Bologna and other processed lunch meats. Salami. Fatback. Hotdogs. Bratwurst. Salted nuts and seeds. Canned beans with added salt. Canned or smoked fish. Whole eggs or egg yolks. Chicken or turkey with skin. Dairy Whole or 2% milk, cream, and half-and-half. Whole or full-fat cream cheese. Whole-fat or sweetened yogurt. Full-fat cheese. Nondairy creamers. Whipped toppings.  Processed cheese and cheese spreads. Fats and oils Butter. Stick margarine. Lard. Shortening. Ghee. Bacon fat. Tropical oils, such as coconut, palm kernel, or palm oil. Seasoning and other foods Salted popcorn and pretzels. Onion salt, garlic salt, seasoned salt, table salt, and sea salt. Worcestershire sauce. Tartar sauce. Barbecue sauce. Teriyaki sauce. Soy sauce, including reduced-sodium. Steak sauce. Canned and packaged gravies. Fish sauce. Oyster sauce. Cocktail sauce. Horseradish that you find on the shelf. Ketchup. Mustard. Meat flavorings and tenderizers. Bouillon cubes. Hot sauce and Tabasco sauce. Premade or packaged marinades. Premade or packaged taco seasonings. Relishes. Regular salad dressings. Where to find more information:  National Heart, Lung, and Blood Institute: www.nhlbi.nih.gov  American Heart Association: www.heart.org Summary  The DASH eating plan is a healthy eating plan that has been shown to reduce high blood pressure (hypertension). It may also reduce your risk for type 2 diabetes, heart disease, and stroke.  With the DASH eating plan, you should limit salt (sodium) intake to 2,300 mg a day. If you have hypertension, you may need to reduce your sodium intake to 1,500 mg a day.  When on the DASH eating plan, aim to eat more fresh fruits and vegetables, whole grains, lean proteins, low-fat dairy, and heart-healthy fats.  Work with your health care provider or diet and nutrition specialist (dietitian) to adjust your eating plan to your   individual calorie needs. This information is not intended to replace advice given to you by your health care provider. Make sure you discuss any questions you have with your health care provider. Document Revised: 08/31/2017 Document Reviewed: 09/11/2016 Elsevier Patient Education  2020 [employer]  

## 2020-05-14 ENCOUNTER — Other Ambulatory Visit: Payer: Self-pay | Admitting: Family Medicine

## 2020-05-14 DIAGNOSIS — I10 Essential (primary) hypertension: Secondary | ICD-10-CM

## 2020-07-05 ENCOUNTER — Ambulatory Visit: Payer: Self-pay | Admitting: Family Medicine

## 2020-07-16 ENCOUNTER — Other Ambulatory Visit: Payer: Self-pay

## 2020-07-16 ENCOUNTER — Ambulatory Visit (INDEPENDENT_AMBULATORY_CARE_PROVIDER_SITE_OTHER): Payer: Self-pay | Admitting: Family Medicine

## 2020-07-16 ENCOUNTER — Encounter: Payer: Self-pay | Admitting: Family Medicine

## 2020-07-16 VITALS — BP 137/81 | HR 80 | Temp 98.6°F | Resp 16 | Ht 69.0 in | Wt 260.0 lb

## 2020-07-16 DIAGNOSIS — G4733 Obstructive sleep apnea (adult) (pediatric): Secondary | ICD-10-CM

## 2020-07-16 DIAGNOSIS — J302 Other seasonal allergic rhinitis: Secondary | ICD-10-CM

## 2020-07-16 DIAGNOSIS — R232 Flushing: Secondary | ICD-10-CM

## 2020-07-16 DIAGNOSIS — I10 Essential (primary) hypertension: Secondary | ICD-10-CM

## 2020-07-16 MED ORDER — FLUTICASONE PROPIONATE 50 MCG/ACT NA SUSP: NASAL | 2 refills | Status: DC

## 2020-07-16 NOTE — Progress Notes (Signed)
Established patient visit   Patient: Rhonda Williamson   DOB: 1966/05/18   54 y.o. Female  MRN: [id number] Visit Date: 07/16/2020  Today's healthcare provider: Vernie Murders, PA   Chief Complaint  Patient presents with  . Follow-up  . Hypertension   Subjective    HPI  Hypertension, follow-up  BP Readings from Last 3 Encounters:  07/16/20 137/81  02/26/20 137/82  12/17/19 (!) 163/90   Wt Readings from Last 3 Encounters:  07/16/20 260 lb (117.9 kg)  02/26/20 262 lb (118.8 kg)  12/17/19 257 lb (116.6 kg)     She was last seen for hypertension 5 months ago.  BP at that visit was 137/21. Management since that visit includes; Amlodipine 10 mg qd and Benazepril 20 mg. She reports good compliance with treatment. She is not having side effects. none She is not exercising. She is not adherent to low salt diet.   Outside blood pressures are not checking.  She does not smoke.  Use of agents associated with hypertension: NSAIDS.   --------------------------------------------------------------------  History reviewed. No pertinent past medical history. Past Surgical History:  Procedure Laterality Date  . ABDOMINAL HYSTERECTOMY  1998  . BUNIONECTOMY  2005  . CESAREAN SECTION  1993  . TUBAL LIGATION  1993   Social History   Tobacco Use  . Smoking status: Former Research scientist (life sciences)  . Smokeless tobacco: Never Used  . Tobacco comment: QUIT IN 2004  Substance Use Topics  . Alcohol use: No    Alcohol/week: 0.0 standard drinks  . Drug use: No   Family Status  Relation Name Status  . Mother  Deceased at age 14  . Father  Deceased at age 102  . Sister 1 Deceased  . Brother 3 Alive  . Sister 2 Deceased at age 25       died of cancer on 24-May-2023  . Brother 1 Deceased       ACCIDENTAL DEATH  . Brother 2 Deceased  . Sister 3 Alive       BREAST CYST  . Sister 4 Alive  . Neg Hx  (Not Specified)   Allergies  Allergen Reactions  . Latex Itching      Medications: Outpatient Medications Prior to Visit  Medication Sig  . acetaminophen (TYLENOL) 325 MG tablet Take 2 tablets (650 mg total) by mouth every 6 (six) hours as needed for mild pain or headache (fever >/= 101).  Marland Kitchen albuterol (VENTOLIN HFA) 108 (90 Base) MCG/ACT inhaler Inhale 2 puffs into the lungs every 6 (six) hours as needed for wheezing or shortness of breath.  Marland Kitchen amLODipine (NORVASC) 10 MG tablet TAKE 1 TABLET BY MOUTH DAILY. NEED FOLLOW UP APPT.  . benazepril (LOTENSIN) 20 MG tablet TAKE 1 TABLET BY MOUTH DAILY  . fluticasone (FLONASE) 50 MCG/ACT nasal spray 2 (TWO) SPRAYS IN EACH NOSTRIL AT BEDTIME (Patient taking differently: Place 2 sprays into both nostrils at bedtime. )  . promethazine-dextromethorphan (PROMETHAZINE-DM) 6.25-15 MG/5ML syrup Take 5 mLs by mouth 4 (four) times daily as needed.   No facility-administered medications prior to visit.    Review of Systems  Constitutional: Negative for appetite change, chills, fatigue and fever.  Respiratory: Negative for chest tightness and shortness of breath.   Cardiovascular: Negative for chest pain and palpitations.  Gastrointestinal: Negative for abdominal pain, nausea and vomiting.  Neurological: Negative for dizziness and weakness.      Objective    BP 137/81 (BP Location: Right Arm, Patient Position: Sitting,  Cuff Size: Large)   Pulse 80   Temp 98.6 F (37 C) (Oral)   Resp 16   Ht 5\' 9"  (1.753 m)   Wt 260 lb (117.9 kg)   LMP 07/19/1995 (Approximate)   SpO2 98%   BMI 38.40 kg/m  BP Readings from Last 3 Encounters:  07/16/20 137/81  02/26/20 137/82  12/17/19 (!) 163/90   Wt Readings from Last 3 Encounters:  07/16/20 260 lb (117.9 kg)  02/26/20 262 lb (118.8 kg)  12/17/19 257 lb (116.6 kg)    Physical Exam Constitutional:      General: She is not in acute distress.    Appearance: She is well-developed.  HENT:     Head: Normocephalic and atraumatic.     Right Ear: Hearing and tympanic membrane  normal.     Left Ear: Hearing and tympanic membrane normal.     Nose: Nose normal.     Mouth/Throat:     Pharynx: Oropharynx is clear.  Eyes:     General: Lids are normal. No scleral icterus.       Right eye: No discharge.        Left eye: No discharge.     Conjunctiva/sclera: Conjunctivae normal.  Cardiovascular:     Rate and Rhythm: Normal rate and regular rhythm.     Heart sounds: Normal heart sounds.  Pulmonary:     Effort: Pulmonary effort is normal. No respiratory distress.     Breath sounds: Normal breath sounds.  Abdominal:     General: Bowel sounds are normal.     Palpations: Abdomen is soft.  Musculoskeletal:        General: Normal range of motion.  Skin:    Findings: No lesion or rash.  Neurological:     Mental Status: She is alert and oriented to person, place, and time.  Psychiatric:        Speech: Speech normal.        Behavior: Behavior normal.        Thought Content: Thought content normal.     No results found for any visits on 07/16/20.  Assessment & Plan     1. Hot flashes Return of hot flashes and sweats over the past few months. Had a partial hysterectomy in 1996. No menses or vaginal discharge. May try Estroven and will get labs to confirm menopause. Recheck pending labs. - CBC with Differential/Platelet - Comprehensive metabolic panel - TSH - Estrogens, total  2. Essential (primary) hypertension Well controlled  BP with Benazepril 20 mg qd. No cough or angioedema. Recheck labs. Continue NASH diet. - CBC with Differential/Platelet - Comprehensive metabolic panel - TSH  3. Obstructive sleep apnea syndrome Resting well with use of CPAP every night.  4. Seasonal allergic rhinitis, unspecified trigger Stuffiness and sneezing with rhinorrhea during the fall pollen season. Needs refill of Flonase Nasal Spray. - fluticasone (FLONASE) 50 MCG/ACT nasal spray; 2 (TWO) SPRAYS IN EACH NOSTRIL AT BEDTIME  Dispense: 16 g; Refill: 2   No follow-ups on  file.      Andres Shad, PA, have reviewed all documentation for this visit. The documentation on 07/16/20 for the exam, diagnosis, procedures, and orders are all accurate and complete.    Vernie Murders, [place] [phone] (phone) [phone] (fax)  [place]

## 2020-07-16 NOTE — Patient Instructions (Signed)
Menopause Menopause is the normal time of life when menstrual periods stop completely. It is usually confirmed by 12 months without a menstrual period. The transition to menopause (perimenopause) most often happens between the ages of 45 and 55. During perimenopause, hormone levels change in your body, which can cause symptoms and affect your health. Menopause may increase your risk for:  Loss of bone (osteoporosis), which causes bone breaks (fractures).  Depression.  Hardening and narrowing of the arteries (atherosclerosis), which can cause heart attacks and strokes. What are the causes? This condition is usually caused by a natural change in hormone levels that happens as you get older. The condition may also be caused by surgery to remove both ovaries (bilateral oophorectomy). What increases the risk? This condition is more likely to start at an earlier age if you have certain medical conditions or treatments, including:  A tumor of the pituitary gland in the brain.  A disease that affects the ovaries and hormone production.  Radiation treatment for cancer.  Certain cancer treatments, such as chemotherapy or hormone (anti-estrogen) therapy.  Heavy smoking and excessive alcohol use.  Family history of early menopause. This condition is also more likely to develop earlier in women who are very thin. What are the signs or symptoms? Symptoms of this condition include:  Hot flashes.  Irregular menstrual periods.  Night sweats.  Changes in feelings about sex. This could be a decrease in sex drive or an increased comfort around your sexuality.  Vaginal dryness and thinning of the vaginal walls. This may cause painful intercourse.  Dryness of the skin and development of wrinkles.  Headaches.  Problems sleeping (insomnia).  Mood swings or irritability.  Memory problems.  Weight gain.  Hair growth on the face and chest.  Bladder infections or problems with urinating. How  is this diagnosed? This condition is diagnosed based on your medical history, a physical exam, your age, your menstrual history, and your symptoms. Hormone tests may also be done. How is this treated? In some cases, no treatment is needed. You and your health care provider should make a decision together about whether treatment is necessary. Treatment will be based on your individual condition and preferences. Treatment for this condition focuses on managing symptoms. Treatment may include:  Menopausal hormone therapy (MHT).  Medicines to treat specific symptoms or complications.  Acupuncture.  Vitamin or herbal supplements. Before starting treatment, make sure to let your health care provider know if you have a personal or family history of:  Heart disease.  Breast cancer.  Blood clots.  Diabetes.  Osteoporosis. Follow these instructions at home: Lifestyle  Do not use any products that contain nicotine or tobacco, such as cigarettes and e-cigarettes. If you need help quitting, ask your health care provider.  Get at least 30 minutes of physical activity on 5 or more days each week.  Avoid alcoholic and caffeinated beverages, as well as spicy foods. This may help prevent hot flashes.  Get 7-8 hours of sleep each night.  If you have hot flashes, try: ? Dressing in layers. ? Avoiding things that may trigger hot flashes, such as spicy food, warm places, or stress. ? Taking slow, deep breaths when a hot flash starts. ? Keeping a fan in your home and office.  Find ways to manage stress, such as deep breathing, meditation, or journaling.  Consider going to group therapy with other women who are having menopause symptoms. Ask your health care provider about recommended group therapy meetings. Eating and   drinking  Eat a healthy, balanced diet that contains whole grains, lean protein, low-fat dairy, and plenty of fruits and vegetables.  Your health care provider may recommend  adding more soy to your diet. Foods that contain soy include tofu, tempeh, and soy milk.  Eat plenty of foods that contain calcium and vitamin D for bone health. Items that are rich in calcium include low-fat milk, yogurt, beans, almonds, sardines, broccoli, and kale. Medicines  Take over-the-counter and prescription medicines only as told by your health care provider.  Talk with your health care provider before starting any herbal supplements. If prescribed, take vitamins and supplements as told by your health care provider. These may include: ? Calcium. Women age 51 and older should get 1,200 mg (milligrams) of calcium every day. ? Vitamin D. Women need 600-800 International Units of vitamin D each day. ? Vitamins B12 and B6. Aim for 50 micrograms of B12 and 1.5 mg of B6 each day. General instructions  Keep track of your menstrual periods, including: ? When they occur. ? How heavy they are and how long they last. ? How much time passes between periods.  Keep track of your symptoms, noting when they start, how often you have them, and how long they last.  Use vaginal lubricants or moisturizers to help with vaginal dryness and improve comfort during sex.  Keep all follow-up visits as told by your health care provider. This is important. This includes any group therapy or counseling. Contact a health care provider if:  You are still having menstrual periods after age 55.  You have pain during sex.  You have not had a period for 12 months and you develop vaginal bleeding. Get help right away if:  You have: ? Severe depression. ? Excessive vaginal bleeding. ? Pain when you urinate. ? A fast or irregular heart beat (palpitations). ? Severe headaches. ? Abdomen (abdominal) pain or severe indigestion.  You fell and you think you have a broken bone.  You develop leg or chest pain.  You develop vision problems.  You feel a lump in your breast. Summary  Menopause is the normal  time of life when menstrual periods stop completely. It is usually confirmed by 12 months without a menstrual period.  The transition to menopause (perimenopause) most often happens between the ages of 45 and 55.  Symptoms can be managed through medicines, lifestyle changes, and complementary therapies such as acupuncture.  Eat a balanced diet that is rich in nutrients to promote bone health and heart health and to manage symptoms during menopause. This information is not intended to replace advice given to you by your health care provider. Make sure you discuss any questions you have with your health care provider. Document Revised: 08/31/2017 Document Reviewed: 10/21/2016 Elsevier Patient Education  2020 Elsevier Inc.  

## 2020-08-10 ENCOUNTER — Other Ambulatory Visit: Payer: Self-pay | Admitting: Family Medicine

## 2020-08-10 DIAGNOSIS — I10 Essential (primary) hypertension: Secondary | ICD-10-CM

## 2020-08-10 NOTE — Telephone Encounter (Signed)
Requested Prescriptions  Pending Prescriptions Disp Refills  . amLODipine (NORVASC) 10 MG tablet [Pharmacy Med Name: AMLODIPINE BESYLATE 10MG  TABLETS] 90 tablet 0    Sig: TAKE 1 TABLET BY MOUTH DAILY. NEED FOLLOW UP APPT.     Cardiovascular:  Calcium Channel Blockers Passed - 08/10/2020  3:37 AM      Passed - Last BP in normal range    BP Readings from Last 1 Encounters:  07/16/20 137/81         Passed - Valid encounter within last 6 months    Recent Outpatient Visits          3 weeks ago Hot flashes   [place], [place], [place]   5 months ago Essential (primary) hypertension   [facility], Vickki Muff, [place]   9 months ago [place], [place], PA-C   11 months ago Essential (primary) hypertension   [facility], Vickki Muff, [place]   2 years ago Elevated fasting glucose   [facility], [place], [place]             . benazepril (LOTENSIN) 20 MG tablet [Pharmacy Med Name: BENAZEPRIL 20MG  TABLETS] 90 tablet 0    Sig: TAKE 1 TABLET BY MOUTH DAILY     Cardiovascular:  ACE Inhibitors Failed - 08/10/2020  3:37 AM      Failed - Cr in normal range and within 180 days    Creatinine, Ser  Date Value Ref Range Status  10/18/2019 0.52 0.44 - 1.00 mg/dL Final         Failed - K in normal range and within 180 days    Potassium  Date Value Ref Range Status  10/18/2019 3.8 3.5 - 5.1 mmol/L Final         Passed - Patient is not pregnant      Passed - Last BP in normal range    BP Readings from Last 1 Encounters:  07/16/20 137/81         Passed - Valid encounter within last 6 months    Recent Outpatient Visits          3 weeks ago Hot flashes   [place], [place], [place]   5 months ago Essential (primary) hypertension   [facility], Vickki Muff, [place]   9 months ago [place], [place],  PA-C   11 months ago Essential (primary) hypertension   [facility], [place], [place]   2 years ago Elevated fasting glucose   [facility], [place], [place]

## 2020-08-10 NOTE — Telephone Encounter (Signed)
Requested medications are due for refill today?   Yes  Requested medications are on active medication list?  Yes  Last Refill: 05/14/2020  # 90 with no refills   Future visit scheduled?  No   Notes to Clinic:  Medication failed Rx refill protocol due to no labs within past 180 days.  Last labs were performed on 10/18/2019.

## 2021-01-11 ENCOUNTER — Other Ambulatory Visit: Payer: Self-pay | Admitting: Family Medicine

## 2021-02-07 ENCOUNTER — Other Ambulatory Visit: Payer: Self-pay | Admitting: Family Medicine

## 2021-02-07 DIAGNOSIS — J4520 Mild intermittent asthma, uncomplicated: Secondary | ICD-10-CM

## 2021-02-07 DIAGNOSIS — I10 Essential (primary) hypertension: Secondary | ICD-10-CM

## 2021-02-07 NOTE — Telephone Encounter (Signed)
Requested medications are due for refill today.  yes  Requested medications are on the active medications list.  yes  Last refill. 08/19/2019  Future visit scheduled.   no  Notes to clinic.  Prescription is expired.

## 2021-02-07 NOTE — Telephone Encounter (Signed)
Requested medication (s) are due for refill today: yes  Requested medication (s) are on the active medication list: yes   Last refill:  11/08/2020  Future visit scheduled:no  Notes to clinic:  overdue for 6 month follow up    Requested Prescriptions  Pending Prescriptions Disp Refills   amLODipine (NORVASC) 10 MG tablet [Pharmacy Med Name: AMLODIPINE BESYLATE 10MG  TABLETS] 90 tablet 1    Sig: TAKE 1 TABLET BY MOUTH DAILY. NEED FOLLOW UP APPT.      Cardiovascular:  Calcium Channel Blockers Failed - 02/07/2021  3:37 AM      Failed - Valid encounter within last 6 months    Recent Outpatient Visits           6 months ago Hot flashes   [place], Vickki Muff, PA-C   11 months ago Essential (primary) hypertension   [facility], Vickki Muff, PA-C   1 year ago [place], [place], PA-C   1 year ago Essential (primary) hypertension   [facility], Vickki Muff, PA-C   2 years ago Elevated fasting glucose   [facility], Driscilla Grammes                Passed - Last BP in normal range    BP Readings from Last 1 Encounters:  07/16/20 137/81            benazepril (LOTENSIN) 20 MG tablet [Pharmacy Med Name: BENAZEPRIL 20MG  TABLETS] 90 tablet 1    Sig: TAKE 1 TABLET BY MOUTH DAILY      Cardiovascular:  ACE Inhibitors Failed - 02/07/2021  3:37 AM      Failed - Cr in normal range and within 180 days    Creatinine, Ser  Date Value Ref Range Status  10/18/2019 0.52 0.44 - 1.00 mg/dL Final          Failed - K in normal range and within 180 days    Potassium  Date Value Ref Range Status  10/18/2019 3.8 3.5 - 5.1 mmol/L Final          Failed - Valid encounter within last 6 months    Recent Outpatient Visits           6 months ago Hot flashes   [place], Vickki Muff, PA-C   11 months ago Essential (primary) hypertension    [facility], Vickki Muff, PA-C   1 year ago [place], [place], PA-C   1 year ago Essential (primary) hypertension   [facility], Vickki Muff, PA-C   2 years ago Elevated fasting glucose   [facility], Vickki Muff, [place] - Patient is not pregnant      Passed - Last BP in normal range    BP Readings from Last 1 Encounters:  07/16/20 137/81

## 2021-02-16 ENCOUNTER — Ambulatory Visit
Admission: RE | Admit: 2021-02-16 | Discharge: 2021-02-16 | Disposition: A | Discharge status: Home / Self Care | Payer: Self-pay | Source: Ambulatory Visit | Attending: Oncology | Admitting: Oncology

## 2021-02-16 ENCOUNTER — Other Ambulatory Visit: Payer: Self-pay

## 2021-02-16 ENCOUNTER — Ambulatory Visit: Payer: Self-pay | Attending: Oncology

## 2021-02-16 VITALS — BP 155/87 | HR 73 | Temp 96.4°F | Ht 69.29 in | Wt 266.2 lb

## 2021-02-16 DIAGNOSIS — Z0000 Encounter for general adult medical examination without abnormal findings: Secondary | ICD-10-CM | POA: Insufficient documentation

## 2021-02-16 NOTE — Progress Notes (Signed)
  Subjective:     Patient ID: Rhonda Williamson, female   DOB: Sep 11, 1966, 55 y.o.   MRN: [id number]  HPI   Review of Systems     Objective:   Physical Exam Chest:  Breasts:     Right: No swelling, bleeding, inverted nipple, mass, nipple discharge, skin change or tenderness.     Left: No swelling, bleeding, inverted nipple, mass, nipple discharge, skin change or tenderness.      Comments: Large pendulous breasts       Assessment:     55 year old patient returns for annual BCCCP screening.  Patient screened, and meets BCCCP eligibility.  Patient does not have insurance, Medicare or Medicaid. Instructed patient on breast self awareness using teach back method.  Clinical breast exam unremarkable. No mass or lump palpated.   Risk Assessment    Risk Scores      02/16/2021 12/17/2019   Last edited by: Rico Junker, RN Theodore Demark, RN   5-year risk: 1.4 % 1.3 %   Lifetime risk: 7.7 % 8 %            Plan:     Sent for bilateral screening mammogram.

## 2021-02-17 NOTE — Progress Notes (Signed)
Letter mailed from [facility] to notify of normal mammogram results.  Patient to return in one year for annual screening.  Copy to [facility]. 

## 2021-02-25 ENCOUNTER — Ambulatory Visit (INDEPENDENT_AMBULATORY_CARE_PROVIDER_SITE_OTHER): Payer: No Typology Code available for payment source | Admitting: Family Medicine

## 2021-02-25 ENCOUNTER — Other Ambulatory Visit: Payer: Self-pay

## 2021-02-25 ENCOUNTER — Encounter: Payer: Self-pay | Admitting: Family Medicine

## 2021-02-25 VITALS — BP 139/84 | HR 82 | Wt 269.0 lb

## 2021-02-25 DIAGNOSIS — L679 Hair color and hair shaft abnormality, unspecified: Secondary | ICD-10-CM | POA: Diagnosis not present

## 2021-02-25 DIAGNOSIS — Z1211 Encounter for screening for malignant neoplasm of colon: Secondary | ICD-10-CM

## 2021-02-25 DIAGNOSIS — L739 Follicular disorder, unspecified: Secondary | ICD-10-CM

## 2021-02-25 DIAGNOSIS — I10 Essential (primary) hypertension: Secondary | ICD-10-CM

## 2021-02-25 DIAGNOSIS — G4733 Obstructive sleep apnea (adult) (pediatric): Secondary | ICD-10-CM | POA: Diagnosis not present

## 2021-02-25 MED ORDER — AMLODIPINE BESYLATE 10 MG PO TABS: ORAL_TABLET | ORAL | 3 refills | Status: DC

## 2021-02-25 MED ORDER — BENAZEPRIL HCL 20 MG PO TABS: 20.0000 mg | ORAL_TABLET | Freq: Every day | ORAL | 1 refills | Status: DC

## 2021-02-25 NOTE — Progress Notes (Signed)
Established patient visit   Patient: Rhonda Williamson   DOB: 03-14-1966   55 y.o. Female  MRN: [id number] Visit Date: 02/25/2021  Today's healthcare provider: Vernie Murders, PA-C   Chief Complaint  Patient presents with  . Rash   Subjective    Rash This is a recurrent problem. The problem has been gradually worsening since onset. The rash is diffuse. The rash is characterized by itchiness and dryness. She was exposed to nothing. Pertinent negatives include no congestion, cough, facial edema, fever or nail changes. Her past medical history is significant for eczema.    Hair loss  Patient Active Problem List   Diagnosis Date Noted  . COVID-19 10/16/2019  . Pneumonia due to COVID-19 virus 10/16/2019  . CN (constipation) 05/11/2015  . LBP (low back pain) 05/11/2015  . Obstructive apnea 05/11/2015  . Mucositis oral 05/11/2015  . Family history of diabetes mellitus 03/29/2010  . Awareness of heartbeats 03/29/2010  . 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 Disease of hair and hair follicles 74/05/1447  . Tobacco use 10/19/2008  . Apnea, sleep 10/19/2008  . Essential (primary) hypertension 12/28/2003   No past medical history on file.  Family History  Problem Relation Age of Onset  . Ovarian cancer Mother   . Lung cancer Father   . Lung cancer Sister   . Bone cancer Sister   . CVA Sister   . Emphysema Brother   . Hypertension Sister   . Breast cancer Neg Hx    Past Surgical History:  Procedure Laterality Date  . ABDOMINAL HYSTERECTOMY  1998  . BUNIONECTOMY  2005  . CESAREAN SECTION  1993  . TUBAL LIGATION  1993   Social History   Tobacco Use  . Smoking status: Former Research scientist (life sciences)  . Smokeless tobacco: Never Used  . Tobacco comment: QUIT IN 2004  Substance Use Topics  . Alcohol use: No    Alcohol/week: 0.0 standard drinks  . Drug use: No   Allergies  Allergen Reactions  . Latex Itching     Medications: Outpatient Medications Prior to Visit  Medication Sig  .  acetaminophen (TYLENOL) 325 MG tablet Take 2 tablets (650 mg total) by mouth every 6 (six) hours as needed for mild pain or headache (fever >/= 101).  Marland Kitchen albuterol (VENTOLIN HFA) 108 (90 Base) MCG/ACT inhaler INHALE 2 PUFFS INTO THE LUNGS EVERY 6 HOURS AS NEEDED FOR WHEEZING OR SHORTNESS OF BREATH  . amLODipine (NORVASC) 10 MG tablet TAKE 1 TABLET BY MOUTH DAILY. NEED FOLLOW UP APPT.  . benazepril (LOTENSIN) 20 MG tablet TAKE 1 TABLET BY MOUTH DAILY  . fluticasone (FLONASE) 50 MCG/ACT nasal spray 2 (TWO) SPRAYS IN EACH NOSTRIL AT BEDTIME  . promethazine-dextromethorphan (PROMETHAZINE-DM) 6.25-15 MG/5ML syrup Take 5 mLs by mouth 4 (four) times daily as needed.   No facility-administered medications prior to visit.    Review of Systems  Constitutional: Negative.  Negative for fever.  HENT: Negative for congestion.   Respiratory: Negative.  Negative for cough.   Skin: Positive for rash. Negative for nail changes.     Objective    BP 139/84 (BP Location: Right Arm, Patient Position: Sitting, Cuff Size: Large)   Pulse 82   Wt 269 lb (122 kg)   LMP 07/19/1995 (Approximate)   BMI 39.39 kg/m     Physical Exam Constitutional:      General: She is not in acute distress.    Appearance: She is well-developed.  HENT:     Head:  Normocephalic and atraumatic.     Right Ear: Hearing normal.     Left Ear: Hearing normal.     Nose: Nose normal.  Eyes:     General: Lids are normal. No scleral icterus.       Right eye: No discharge.        Left eye: No discharge.     Conjunctiva/sclera: Conjunctivae normal.  Cardiovascular:     Rate and Rhythm: Normal rate and regular rhythm.     Heart sounds: Normal heart sounds.  Pulmonary:     Effort: Pulmonary effort is normal. No respiratory distress.     Breath sounds: Normal breath sounds.  Abdominal:     General: Bowel sounds are normal.     Palpations: Abdomen is soft.  Musculoskeletal:        General: Normal range of motion.     Cervical back:  Neck supple.  Skin:    Findings: No lesion or rash.  Neurological:     Mental Status: She is alert and oriented to person, place, and time.  Psychiatric:        Speech: Speech normal.        Behavior: Behavior normal.        Thought Content: Thought content normal.      No results found for any visits on 02/25/21.  Assessment & Plan     1. Disease of hair and hair follicles Long term hair loss/thinning. Slowly returning. Wearing tight wigs. Check labs for metabolic disorder and may use Rogaine for Women. - CBC with Differential/Platelet - Comprehensive metabolic panel - TSH  2. Essential (primary) hypertension Well controlled and needs refill of meds. Recheck follow up labs. - amLODipine (NORVASC) 10 MG tablet; TAKE 1 TABLET BY MOUTH DAILY.  Dispense: 90 tablet; Refill: 3 - benazepril (LOTENSIN) 20 MG tablet; Take 1 tablet (20 mg total) by mouth daily.  Dispense: 90 tablet; Refill: 1 - CBC with Differential/Platelet - Comprehensive metabolic panel - TSH - Lipid panel  3. Obstructive sleep apnea syndrome Old machine that is not working well after 10 years of use. Schedule home sleep test to assess need for a newer CPAP that is capable of self-titration.  - Home sleep test - CBC with Differential/Platelet  4. Screening for colon cancer Past due for screening. - Ambulatory referral to Gastroenterology  No follow-ups on file.      I, Bhakti Labella, PA-C, have reviewed all documentation for this visit. The documentation on 03/09/21 for the exam, diagnosis, procedures, and orders are all accurate and complete.    Vernie Murders, PA-C  [employer] [phone] (phone) [phone] (fax)  [place]

## 2021-03-02 LAB — COMPREHENSIVE METABOLIC PANEL
ALT: 25 IU/L (ref 0–32)
AST: 19 IU/L (ref 0–40)
Albumin/Globulin Ratio: 1.4 (ref 1.2–2.2)
Albumin: 4.4 g/dL (ref 3.8–4.9)
Alkaline Phosphatase: 88 IU/L (ref 44–121)
BUN/Creatinine Ratio: 17 (ref 9–23)
BUN: 11 mg/dL (ref 6–24)
Bilirubin Total: 0.3 mg/dL (ref 0.0–1.2)
CO2: 21 mmol/L (ref 20–29)
Calcium: 9.5 mg/dL (ref 8.7–10.2)
Chloride: 106 mmol/L (ref 96–106)
Creatinine, Ser: 0.66 mg/dL (ref 0.57–1.00)
Globulin, Total: 3.2 g/dL (ref 1.5–4.5)
Glucose: 103 mg/dL — ABNORMAL HIGH (ref 65–99)
Potassium: 4.2 mmol/L (ref 3.5–5.2)
Sodium: 147 mmol/L — ABNORMAL HIGH (ref 134–144)
Total Protein: 7.6 g/dL (ref 6.0–8.5)
eGFR: 103 mL/min/{1.73_m2} (ref >59)

## 2021-03-02 LAB — CBC WITH DIFFERENTIAL/PLATELET
Basophils Absolute: 0 10*3/uL (ref 0.0–0.2)
Basos: 1 %
EOS (ABSOLUTE): 0.2 10*3/uL (ref 0.0–0.4)
Eos: 2 %
Hematocrit: 40.3 % (ref 34.0–46.6)
Hemoglobin: 13.6 g/dL (ref 11.1–15.9)
Immature Grans (Abs): 0 10*3/uL (ref 0.0–0.1)
Immature Granulocytes: 0 %
Lymphocytes Absolute: 4.8 10*3/uL — ABNORMAL HIGH (ref 0.7–3.1)
Lymphs: 57 %
MCH: 29.9 pg (ref 26.6–33.0)
MCHC: 33.7 g/dL (ref 31.5–35.7)
MCV: 89 fL (ref 79–97)
Monocytes Absolute: 0.4 10*3/uL (ref 0.1–0.9)
Monocytes: 4 %
Neutrophils Absolute: 3.1 10*3/uL (ref 1.4–7.0)
Neutrophils: 36 %
Platelets: 271 10*3/uL (ref 150–450)
RBC: 4.55 x10E6/uL (ref 3.77–5.28)
RDW: 13.4 % (ref 11.7–15.4)
WBC: 8.5 10*3/uL (ref 3.4–10.8)

## 2021-03-02 LAB — LIPID PANEL
Chol/HDL Ratio: 3 ratio (ref 0.0–4.4)
Cholesterol, Total: 130 mg/dL (ref 100–199)
HDL: 43 mg/dL (ref >39)
LDL Chol Calc (NIH): 69 mg/dL (ref 0–99)
Triglycerides: 93 mg/dL (ref 0–149)
VLDL Cholesterol Cal: 18 mg/dL (ref 5–40)

## 2021-03-02 LAB — TSH: TSH: 1.44 u[IU]/mL (ref 0.450–4.500)

## 2021-03-03 ENCOUNTER — Telehealth: Payer: Self-pay

## 2021-03-03 NOTE — Telephone Encounter (Signed)
Copied from [place] [id number]. Topic: General - Other >> Mar 03, 2021  8:51 AM Alanda Slim E wrote: Reason for CRM: Pt would like a call to go over her lab results / please advise

## 2021-03-03 NOTE — Telephone Encounter (Signed)
Pt called about her labs.  She wanted to let you know she was not fasting so you glucose is a little high.   Thanks,   -Mickel Baas

## 2021-03-03 NOTE — Telephone Encounter (Signed)
See lab result note.

## 2021-03-08 ENCOUNTER — Telehealth: Payer: Self-pay

## 2021-03-08 NOTE — Telephone Encounter (Signed)
Copied from [place] [id number]. Topic: General - Inquiry >> Mar 08, 2021 11:38 AM Loma Boston wrote: Pls FU must have a new Cpap machine and discussed last visit 5/27. She has questioned a couple times pt states has heard nothing back. She states that machine is way over 56 years old, she needs a script for a new machine, states if that is not easily obtained she is willing to do a sleep study to get another but of course had rather just get another one but willing to do whatever it takes. Pt wants a FU at [phone]

## 2021-03-09 NOTE — Telephone Encounter (Signed)
Placed order for sleep study since this machine has been in use for 10 years. May qualify for a new self-titration machine.

## 2021-03-10 NOTE — Telephone Encounter (Signed)
Patient has been advised of message below, KW

## 2021-03-16 ENCOUNTER — Telehealth (INDEPENDENT_AMBULATORY_CARE_PROVIDER_SITE_OTHER): Payer: Self-pay | Admitting: Gastroenterology

## 2021-03-16 DIAGNOSIS — Z1211 Encounter for screening for malignant neoplasm of colon: Secondary | ICD-10-CM

## 2021-03-16 MED ORDER — NA SULFATE-K SULFATE-MG SULF 17.5-3.13-1.6 GM/177ML PO SOLN: 1.0000 | Freq: Once | ORAL | 0 refills | Status: AC

## 2021-03-16 NOTE — Progress Notes (Signed)
Gastroenterology Pre-Procedure Review  Request Date: 03/25/21 Requesting Physician: Dr. Vicente Males  PATIENT REVIEW QUESTIONS: The patient responded to the following health history questions as indicated:    1. Are you having any GI issues? no 2. Do you have a personal history of Polyps? no 3. Do you have a family history of Colon Cancer or Polyps? no 4. Diabetes Mellitus? no 5. Joint replacements in the past 12 months?no 6. Major health problems in the past 3 months?no 7. Any artificial heart valves, MVP, or defibrillator?no    MEDICATIONS & ALLERGIES:    Patient reports the following regarding taking any anticoagulation/antiplatelet therapy:   Plavix, Coumadin, Eliquis, Xarelto, Lovenox, Pradaxa, Brilinta, or Effient? no Aspirin? no  Patient confirms/reports the following medications:  Current Outpatient Medications  Medication Sig Dispense Refill   Na Sulfate-K Sulfate-Mg Sulf 17.5-3.13-1.6 GM/177ML SOLN Take 1 kit by mouth once for 1 dose. 354 mL 0   acetaminophen (TYLENOL) 325 MG tablet Take 2 tablets (650 mg total) by mouth every 6 (six) hours as needed for mild pain or headache (fever >/= 101).     albuterol (VENTOLIN HFA) 108 (90 Base) MCG/ACT inhaler INHALE 2 PUFFS INTO THE LUNGS EVERY 6 HOURS AS NEEDED FOR WHEEZING OR SHORTNESS OF BREATH 18 g 1   amLODipine (NORVASC) 10 MG tablet TAKE 1 TABLET BY MOUTH DAILY. 90 tablet 3   benazepril (LOTENSIN) 20 MG tablet Take 1 tablet (20 mg total) by mouth daily. 90 tablet 1   fluticasone (FLONASE) 50 MCG/ACT nasal spray 2 (TWO) SPRAYS IN EACH NOSTRIL AT BEDTIME 16 g 2   promethazine-dextromethorphan (PROMETHAZINE-DM) 6.25-15 MG/5ML syrup Take 5 mLs by mouth 4 (four) times daily as needed. 118 mL 0   No current facility-administered medications for this visit.    Patient confirms/reports the following allergies:  Allergies  Allergen Reactions   Latex Itching    No orders of the defined types were placed in this  encounter.   AUTHORIZATION INFORMATION Primary Insurance: 1D#: Group #:  Secondary Insurance: 1D#: Group #:  SCHEDULE INFORMATION: Date: 03/25/21 Time: Location: [place]

## 2021-03-24 ENCOUNTER — Telehealth: Payer: Self-pay

## 2021-03-24 ENCOUNTER — Encounter: Payer: Self-pay | Admitting: Gastroenterology

## 2021-03-24 ENCOUNTER — Other Ambulatory Visit: Payer: Self-pay | Admitting: Family Medicine

## 2021-03-24 DIAGNOSIS — G4733 Obstructive sleep apnea (adult) (pediatric): Secondary | ICD-10-CM

## 2021-03-24 NOTE — Telephone Encounter (Signed)
Reordered for Home Sleep Test.

## 2021-03-24 NOTE — Telephone Encounter (Signed)
Have you seen sleep study results?

## 2021-03-24 NOTE — Telephone Encounter (Signed)
Copied from [place] [id number]. Topic: Referral - Status >> Mar 24, 2021 12:52 PM Erick Blinks wrote: Reason for CRM: Pt called regarding her sleep study, she says this was discussed three weeks ago. Please advise, patient is anxious.   Best contact: [phone]

## 2021-03-25 ENCOUNTER — Telehealth: Payer: Self-pay

## 2021-03-25 ENCOUNTER — Encounter: Payer: Self-pay | Admitting: Gastroenterology

## 2021-03-25 ENCOUNTER — Other Ambulatory Visit: Payer: Self-pay

## 2021-03-25 ENCOUNTER — Encounter
Admission: RE | Disposition: A | Discharge status: Home / Self Care | Payer: Self-pay | Source: Home / Self Care | Attending: Gastroenterology

## 2021-03-25 ENCOUNTER — Ambulatory Visit: Payer: No Typology Code available for payment source | Admitting: Anesthesiology

## 2021-03-25 ENCOUNTER — Ambulatory Visit
Admission: RE | Admit: 2021-03-25 | Discharge: 2021-03-25 | Disposition: A | Discharge status: Home / Self Care | Payer: No Typology Code available for payment source | Attending: Gastroenterology | Admitting: Gastroenterology

## 2021-03-25 DIAGNOSIS — Z1211 Encounter for screening for malignant neoplasm of colon: Secondary | ICD-10-CM | POA: Diagnosis present

## 2021-03-25 DIAGNOSIS — Z87891 Personal history of nicotine dependence: Secondary | ICD-10-CM | POA: Insufficient documentation

## 2021-03-25 DIAGNOSIS — Z91040 Latex allergy status: Secondary | ICD-10-CM | POA: Insufficient documentation

## 2021-03-25 DIAGNOSIS — Z79899 Other long term (current) drug therapy: Secondary | ICD-10-CM | POA: Diagnosis not present

## 2021-03-25 HISTORY — DX: Sleep apnea, unspecified: G47.30

## 2021-03-25 HISTORY — PX: COLONOSCOPY WITH PROPOFOL: SHX5780

## 2021-03-25 HISTORY — DX: Essential (primary) hypertension: I10

## 2021-03-25 SURGERY — COLONOSCOPY WITH PROPOFOL
Anesthesia: General

## 2021-03-25 MED ORDER — LIDOCAINE HCL (CARDIAC) PF 100 MG/5ML IV SOSY
PREFILLED_SYRINGE | INTRAVENOUS | Status: DC | PRN
  Administered 2021-03-25: 50 mg via INTRAVENOUS

## 2021-03-25 MED ORDER — NA SULFATE-K SULFATE-MG SULF 17.5-3.13-1.6 GM/177ML PO SOLN: 708.0000 mL | Freq: Once | ORAL | 0 refills | Status: AC

## 2021-03-25 MED ORDER — SODIUM CHLORIDE 0.9 % IV SOLN: INTRAVENOUS | Status: DC

## 2021-03-25 MED ORDER — PROPOFOL 10 MG/ML IV BOLUS
INTRAVENOUS | Status: DC | PRN
  Administered 2021-03-25: 80 mg via INTRAVENOUS

## 2021-03-25 MED ORDER — PROPOFOL 500 MG/50ML IV EMUL
INTRAVENOUS | Status: DC | PRN
  Administered 2021-03-25: 100 ug/kg/min via INTRAVENOUS

## 2021-03-25 MED ORDER — DEXMEDETOMIDINE (PRECEDEX) IN NS 20 MCG/5ML (4 MCG/ML) IV SYRINGE
PREFILLED_SYRINGE | INTRAVENOUS | Status: DC | PRN
  Administered 2021-03-25: 12 ug via INTRAVENOUS

## 2021-03-25 NOTE — Anesthesia Preprocedure Evaluation (Signed)
Anesthesia Evaluation  Patient identified by MRN, date of birth, ID band Patient awake    Reviewed: Allergy & Precautions, NPO status , Patient's Chart, lab work & pertinent test results  History of Anesthesia Complications Negative for: history of anesthetic complications  Airway Mallampati: III       Dental   Pulmonary sleep apnea and Continuous Positive Airway Pressure Ventilation , neg COPD, Not current smoker, former smoker,           Cardiovascular hypertension, Pt. on medications (-) Past MI and (-) CHF (-) dysrhythmias (-) Valvular Problems/Murmurs     Neuro/Psych neg Seizures    GI/Hepatic negative GI ROS, Neg liver ROS,   Endo/Other  neg diabetes  Renal/GU negative Renal ROS     Musculoskeletal   Abdominal   Peds  Hematology   Anesthesia Other Findings   Reproductive/Obstetrics                             Anesthesia Physical Anesthesia Plan  ASA: 2  Anesthesia Plan: General   Post-op Pain Management:    Induction: Intravenous  PONV Risk Score and Plan: 3 and Propofol infusion and TIVA  Airway Management Planned: Nasal Cannula  Additional Equipment:   Intra-op Plan:   Post-operative Plan:   Informed Consent: I have reviewed the patients History and Physical, chart, labs and discussed the procedure including the risks, benefits and alternatives for the proposed anesthesia with the patient or authorized representative who has indicated his/her understanding and acceptance.       Plan Discussed with:   Anesthesia Plan Comments:         Anesthesia Quick Evaluation

## 2021-03-25 NOTE — Telephone Encounter (Signed)
Hi [name], do you see the old order for this? Do I need to cancel it? Simona Huh put another order in.

## 2021-03-25 NOTE — H&P (Signed)
Rhonda Bellows, MD [address], [place], [place], [place], [address] [address], [place], [place], [place], [address] Phone: [phone]  Fax: [phone]  Primary Care Physician:  Rhonda Common, PA-C   Pre-Procedure History & Physical: HPI:  Rhonda Williamson is a 55 y.o. female is here for an colonoscopy.   Past Medical History:  Diagnosis Date   Hypertension    Sleep apnea     Past Surgical History:  Procedure Laterality Date   ABDOMINAL HYSTERECTOMY  1998   BUNIONECTOMY  2005   Rhonda Williamson    Prior to Admission medications   Medication Sig Start Date End Date Taking? Authorizing Provider  acetaminophen (TYLENOL) 325 MG tablet Take 2 tablets (650 mg total) by mouth every 6 (six) hours as needed for mild pain or headache (fever >/= 101). 10/18/19  Yes Rhonda Altes, MD  amLODipine (NORVASC) 10 MG tablet TAKE 1 TABLET BY MOUTH DAILY. 02/25/21  Yes Chrismon, Vickki Muff, PA-C  benazepril (LOTENSIN) 20 MG tablet Take 1 tablet (20 mg total) by mouth daily. 02/25/21  Yes Chrismon, Vickki Muff, PA-C  albuterol (VENTOLIN HFA) 108 (90 Base) MCG/ACT inhaler INHALE 2 PUFFS INTO THE LUNGS EVERY 6 HOURS AS NEEDED FOR WHEEZING OR SHORTNESS OF BREATH 02/07/21   Chrismon, Vickki Muff, PA-C  fluticasone (FLONASE) 50 MCG/ACT nasal spray 2 (TWO) SPRAYS IN EACH NOSTRIL AT BEDTIME 07/16/20   Chrismon, Vickki Muff, PA-C  promethazine-dextromethorphan (PROMETHAZINE-DM) 6.25-15 MG/5ML syrup Take 5 mLs by mouth 4 (four) times daily as needed. 10/24/19   Rhonda Post, PA-C    Allergies as of 03/16/2021 - Review Complete 03/16/2021  Allergen Reaction Noted   Latex Itching 07/18/2017    Family History  Problem Relation Age of Onset   Ovarian cancer Mother    Lung cancer Father    Lung cancer Sister    Bone cancer Sister    CVA Sister    Emphysema Brother    Hypertension Sister    Breast cancer Neg Hx     Social History   Socioeconomic History    Marital status: Married    Spouse name: Not on file   Number of children: Not on file   Years of education: Not on file   Highest education level: Not on file  Occupational History   Not on file  Tobacco Use   Smoking status: Former    Pack years: 0.00   Smokeless tobacco: Never   Tobacco comments:    QUIT IN 2004  Vaping Use   Vaping Use: Never used  Substance and Sexual Activity   Alcohol use: No    Alcohol/week: 0.0 standard drinks   Drug use: No   Sexual activity: Not on file  Other Topics Concern   Not on file  Social History Narrative   Not on file   Social Determinants of Health   Financial Resource Strain: Not on file  Food Insecurity: Not on file  Transportation Needs: Not on file  Physical Activity: Not on file  Stress: Not on file  Social Connections: Not on file  Intimate Partner Violence: Not on file    Review of Systems: See HPI, otherwise negative ROS  Physical Exam: BP (!) 161/94   Pulse 89   Temp (!) 97.4 F (36.3 C) (Temporal)   Resp 18   Ht 5\' 8"  (1.727 m)   Wt 122.5 kg   LMP 07/19/1995 (Approximate)  SpO2 98%   BMI 41.05 kg/m  General:   Alert,  pleasant and cooperative in NAD Head:  Normocephalic and atraumatic. Neck:  Supple; no masses or thyromegaly. Lungs:  Clear throughout to auscultation, normal respiratory effort.    Heart:  +S1, +S2, Regular rate and rhythm, No edema. Abdomen:  Soft, nontender and nondistended. Normal bowel sounds, without guarding, and without rebound.   Neurologic:  Alert and  oriented x4;  grossly normal neurologically.  Impression/Plan: Rhonda Williamson is here for an colonoscopy to be performed for Screening colonoscopy average risk   Risks, benefits, limitations, and alternatives regarding  colonoscopy have been reviewed with the patient.  Questions have been answered.  All parties agreeable.   Rhonda Bellows, MD  03/25/2021, 9:33 AM

## 2021-03-25 NOTE — Transfer of Care (Signed)
Immediate Anesthesia Transfer of Care Note  Patient: Rhonda Williamson  Procedure(s) Performed: COLONOSCOPY WITH PROPOFOL  Patient Location: PACU  Anesthesia Type:General  Level of Consciousness: drowsy  Airway & Oxygen Therapy: Patient Spontanous Breathing  Post-op Assessment: Report given to RN and Post -op Vital signs reviewed and stable  Post vital signs: stable  Last Vitals:  Vitals Value Taken Time  BP 115/81 03/25/21 0959  Temp 35.7 C 03/25/21 0959  Pulse 89 03/25/21 1000  Resp 15 03/25/21 1000  SpO2 94 % 03/25/21 1000  Vitals shown include unvalidated device data.  Last Pain:  Vitals:   03/25/21 0959  TempSrc: Temporal  PainSc: Asleep         Complications: No notable events documented.

## 2021-03-25 NOTE — Telephone Encounter (Signed)
Trish from the endoscopy unit called stating that the patient was scheduled to have her colonoscopy done today but ate breakfast this AM. Therefore, Dr. Vicente Males agrees on rescheduling her for this coming Tuesday 03/29/2021 with a 2 day prep.  I then called patient to inform her and she understood that this time around she will have to do a 2 day prep. Patient had no further questions.

## 2021-03-25 NOTE — Op Note (Signed)
[facility] Patient Name: Rhonda Williamson Procedure Date: 03/25/2021 9:36 AM MRN: [id number] Account #: [id number] Date of Birth: 1966-07-24 Admit Type: Inpatient Age: 55 Room: [facility] ENDO ROOM 4 Gender: Female Note Status: Finalized Procedure:             Colonoscopy Indications:           Screening for colorectal malignant neoplasm Providers:             Jonathon Bellows MD, MD Referring MD:          Vickki Muff. Chrismon, MD (Referring MD) Medicines:             Monitored Anesthesia Care Complications:         No immediate complications. Procedure:             Pre-Anesthesia Assessment:                        - Prior to the procedure, a History and Physical was                         performed, and patient medications, allergies and                         sensitivities were reviewed. The patient's tolerance                         of previous anesthesia was reviewed.                        - The risks and benefits of the procedure and the                         sedation options and risks were discussed with the                         patient. All questions were answered and informed                         consent was obtained.                        - ASA Grade Assessment: II - A patient with mild                         systemic disease.                        After obtaining informed consent, the colonoscope was                         passed under direct vision. Throughout the procedure,                         the patient's blood pressure, pulse, and oxygen                         saturations were monitored continuously. The                         Colonoscope was introduced through the anus  and                         advanced to the the cecum, identified by the                         appendiceal orifice. The colonoscopy was performed                         with ease. The patient tolerated the procedure well.                         The quality  of the bowel preparation was poor. Findings:      The perianal and digital rectal examinations were normal.      A large amount of semi-liquid stool was found in the entire colon,       interfering with visualization. Impression:            - Preparation of the colon was poor.                        - Stool in the entire examined colon.                        - No specimens collected. Recommendation:        - Discharge patient to home (with escort).                        - Resume previous diet.                        - Continue present medications.                        - Repeat colonoscopy in 6 weeks because the bowel                         preparation was suboptimal. Procedure Code(s):     --- Professional ---                        [phone], Colonoscopy, flexible; diagnostic, including                         collection of specimen(s) by brushing or washing, when                         performed (separate procedure) Diagnosis Code(s):     --- Professional ---                        Z12.11, Encounter for screening for malignant neoplasm                         of colon CPT copyright 2019 American Medical Association. All rights reserved. The codes documented in this report are preliminary and upon coder review may  be revised to meet current compliance requirements. Jonathon Bellows, MD Jonathon Bellows MD, MD 03/25/2021 9:59:12 AM This report has been signed electronically. Number of Addenda: 0 Note Initiated On: 03/25/2021 9:36 AM Scope Withdrawal Time: 0 hours 5 minutes 3 seconds  Total  Procedure Duration: 0 hours 7 minutes 39 seconds  Estimated Blood Loss:  Estimated blood loss: none.      [facility]

## 2021-03-25 NOTE — Anesthesia Postprocedure Evaluation (Signed)
Anesthesia Post Note  Patient: Rhonda Williamson  Procedure(s) Performed: COLONOSCOPY WITH PROPOFOL  Patient location during evaluation: Endoscopy Anesthesia Type: General Level of consciousness: awake and alert Pain management: pain level controlled Vital Signs Assessment: post-procedure vital signs reviewed and stable Respiratory status: spontaneous breathing and respiratory function stable Cardiovascular status: stable Anesthetic complications: no   No notable events documented.   Last Vitals:  Vitals:   03/25/21 0851 03/25/21 0959  BP: (!) 161/94 115/81  Pulse: 89   Resp: 18   Temp: (!) 36.3 C (!) 35.7 C  SpO2: 98%     Last Pain:  Vitals:   03/25/21 0959  TempSrc: Temporal  PainSc: Asleep                 Lynann Demetrius K

## 2021-03-29 ENCOUNTER — Ambulatory Visit: Admit: 2021-03-29 | Payer: No Typology Code available for payment source | Admitting: Gastroenterology

## 2021-03-29 ENCOUNTER — Other Ambulatory Visit: Payer: Self-pay

## 2021-03-29 ENCOUNTER — Encounter: Payer: Self-pay | Admitting: Gastroenterology

## 2021-03-29 ENCOUNTER — Ambulatory Visit: Payer: No Typology Code available for payment source | Admitting: Certified Registered"

## 2021-03-29 ENCOUNTER — Encounter
Admission: RE | Disposition: A | Discharge status: Home / Self Care | Payer: Self-pay | Source: Home / Self Care | Attending: Gastroenterology

## 2021-03-29 ENCOUNTER — Ambulatory Visit
Admission: RE | Admit: 2021-03-29 | Discharge: 2021-03-29 | Disposition: A | Discharge status: Home / Self Care | Payer: No Typology Code available for payment source | Attending: Gastroenterology | Admitting: Gastroenterology

## 2021-03-29 DIAGNOSIS — Z8249 Family history of ischemic heart disease and other diseases of the circulatory system: Secondary | ICD-10-CM | POA: Diagnosis not present

## 2021-03-29 DIAGNOSIS — Z1211 Encounter for screening for malignant neoplasm of colon: Secondary | ICD-10-CM | POA: Diagnosis not present

## 2021-03-29 DIAGNOSIS — Z91040 Latex allergy status: Secondary | ICD-10-CM | POA: Insufficient documentation

## 2021-03-29 DIAGNOSIS — K635 Polyp of colon: Secondary | ICD-10-CM | POA: Diagnosis not present

## 2021-03-29 DIAGNOSIS — Z801 Family history of malignant neoplasm of trachea, bronchus and lung: Secondary | ICD-10-CM | POA: Insufficient documentation

## 2021-03-29 DIAGNOSIS — Z823 Family history of stroke: Secondary | ICD-10-CM | POA: Diagnosis not present

## 2021-03-29 DIAGNOSIS — Z825 Family history of asthma and other chronic lower respiratory diseases: Secondary | ICD-10-CM | POA: Insufficient documentation

## 2021-03-29 DIAGNOSIS — Z7951 Long term (current) use of inhaled steroids: Secondary | ICD-10-CM | POA: Diagnosis not present

## 2021-03-29 DIAGNOSIS — D122 Benign neoplasm of ascending colon: Secondary | ICD-10-CM | POA: Insufficient documentation

## 2021-03-29 HISTORY — PX: COLONOSCOPY WITH PROPOFOL: SHX5780

## 2021-03-29 SURGERY — COLONOSCOPY WITH PROPOFOL
Anesthesia: General

## 2021-03-29 MED ORDER — SODIUM CHLORIDE 0.9 % IV SOLN: INTRAVENOUS | Status: DC

## 2021-03-29 MED ORDER — PROPOFOL 10 MG/ML IV BOLUS
INTRAVENOUS | Status: DC | PRN
  Administered 2021-03-29: 70 mg via INTRAVENOUS
  Administered 2021-03-29: 30 mg via INTRAVENOUS

## 2021-03-29 MED ORDER — MIDAZOLAM HCL 5 MG/5ML IJ SOLN
INTRAMUSCULAR | Status: DC | PRN
  Administered 2021-03-29: 2 mg via INTRAVENOUS

## 2021-03-29 MED ORDER — LIDOCAINE 2% (20 MG/ML) 5 ML SYRINGE
INTRAMUSCULAR | Status: DC | PRN
  Administered 2021-03-29: 20 mg via INTRAVENOUS

## 2021-03-29 MED ORDER — MIDAZOLAM HCL 2 MG/2ML IJ SOLN
INTRAMUSCULAR | Status: AC
  Filled 2021-03-29: qty 2

## 2021-03-29 MED ORDER — PROPOFOL 500 MG/50ML IV EMUL
INTRAVENOUS | Status: DC | PRN
  Administered 2021-03-29: 120 ug/kg/min via INTRAVENOUS

## 2021-03-29 NOTE — H&P (Signed)
Jonathon Bellows, MD [address], [place], [place], [place], [address] [address], [place], [place], [place], [address] Phone: [phone]  Fax: [phone]  Primary Care Physician:  Margo Common, PA-C   Pre-Procedure History & Physical: HPI:  Rhonda Williamson is a 55 y.o. female is here for an colonoscopy.   Past Medical History:  Diagnosis Date   Hypertension    Sleep apnea     Past Surgical History:  Procedure Laterality Date   ABDOMINAL HYSTERECTOMY  1998   BUNIONECTOMY  2005   CESAREAN SECTION  1993   COLONOSCOPY WITH PROPOFOL N/A 03/25/2021   Procedure: COLONOSCOPY WITH PROPOFOL;  Surgeon: Jonathon Bellows, MD;  Location: [facility];  Service: Gastroenterology;  Laterality: N/A;   TUBAL LIGATION  1993    Prior to Admission medications   Medication Sig Start Date End Date Taking? Authorizing Provider  amLODipine (NORVASC) 10 MG tablet TAKE 1 TABLET BY MOUTH DAILY. 02/25/21  Yes Chrismon, Vickki Muff, PA-C  benazepril (LOTENSIN) 20 MG tablet Take 1 tablet (20 mg total) by mouth daily. 02/25/21  Yes Chrismon, Vickki Muff, PA-C  acetaminophen (TYLENOL) 325 MG tablet Take 2 tablets (650 mg total) by mouth every 6 (six) hours as needed for mild pain or headache (fever >/= 101). 10/18/19   Cherene Altes, MD  albuterol (VENTOLIN HFA) 108 (90 Base) MCG/ACT inhaler INHALE 2 PUFFS INTO THE LUNGS EVERY 6 HOURS AS NEEDED FOR WHEEZING OR SHORTNESS OF BREATH 02/07/21   Chrismon, Vickki Muff, PA-C  fluticasone (FLONASE) 50 MCG/ACT nasal spray 2 (TWO) SPRAYS IN EACH NOSTRIL AT BEDTIME 07/16/20   Chrismon, Vickki Muff, PA-C  promethazine-dextromethorphan (PROMETHAZINE-DM) 6.25-15 MG/5ML syrup Take 5 mLs by mouth 4 (four) times daily as needed. 10/24/19   Trinna Post, PA-C    Allergies as of 03/25/2021 - Review Complete 03/25/2021  Allergen Reaction Noted   Latex Itching 07/18/2017    Family History  Problem Relation Age of Onset   Ovarian cancer Mother    Lung cancer Father     Lung cancer Sister    Bone cancer Sister    CVA Sister    Emphysema Brother    Hypertension Sister    Breast cancer Neg Hx     Social History   Socioeconomic History   Marital status: Married    Spouse name: Not on file   Number of children: Not on file   Years of education: Not on file   Highest education level: Not on file  Occupational History   Not on file  Tobacco Use   Smoking status: Former    Pack years: 0.00   Smokeless tobacco: Never   Tobacco comments:    QUIT IN 2004  Vaping Use   Vaping Use: Never used  Substance and Sexual Activity   Alcohol use: No    Alcohol/week: 0.0 standard drinks   Drug use: No   Sexual activity: Not on file  Other Topics Concern   Not on file  Social History Narrative   Not on file   Social Determinants of Health   Financial Resource Strain: Not on file  Food Insecurity: Not on file  Transportation Needs: Not on file  Physical Activity: Not on file  Stress: Not on file  Social Connections: Not on file  Intimate Partner Violence: Not on file    Review of Systems: See HPI, otherwise negative ROS  Physical Exam: BP (!) 144/81   Pulse 76   Temp (!)  97.3 F (36.3 C) (Temporal)   Resp 20   Ht 5\' 8"  (1.727 m)   Wt 122.5 kg   LMP 07/19/1995 (Approximate)   SpO2 97%   BMI 41.05 kg/m  General:   Alert,  pleasant and cooperative in NAD Head:  Normocephalic and atraumatic. Neck:  Supple; no masses or thyromegaly. Lungs:  Clear throughout to auscultation, normal respiratory effort.    Heart:  +S1, +S2, Regular rate and rhythm, No edema. Abdomen:  Soft, nontender and nondistended. Normal bowel sounds, without guarding, and without rebound.   Neurologic:  Alert and  oriented x4;  grossly normal neurologically.  Impression/Plan: Rhonda Williamson is here for an colonoscopy to be performed for Screening colonoscopy average risk   Risks, benefits, limitations, and alternatives regarding  colonoscopy have been reviewed with  the patient.  Questions have been answered.  All parties agreeable.   Jonathon Bellows, MD  03/29/2021, 10:09 AM

## 2021-03-29 NOTE — Anesthesia Preprocedure Evaluation (Signed)
Anesthesia Evaluation  Patient identified by MRN, date of birth, ID band Patient awake    Reviewed: Allergy & Precautions, NPO status , Patient's Chart, lab work & pertinent test results  History of Anesthesia Complications Negative for: history of anesthetic complications  Airway Mallampati: III       Dental  (+) Dental Advidsory Given   Pulmonary neg shortness of breath, sleep apnea and Continuous Positive Airway Pressure Ventilation , neg COPD, neg recent URI, Not current smoker, former smoker,           Cardiovascular hypertension, Pt. on medications (-) angina(-) Past MI and (-) CHF (-) dysrhythmias (-) Valvular Problems/Murmurs     Neuro/Psych neg Seizures    GI/Hepatic negative GI ROS, Neg liver ROS,   Endo/Other  neg diabetesMorbid obesity  Renal/GU negative Renal ROS     Musculoskeletal   Abdominal   Peds  Hematology   Anesthesia Other Findings Past Medical History: No date: Hypertension No date: Sleep apnea   Reproductive/Obstetrics                             Anesthesia Physical  Anesthesia Plan  ASA: 3  Anesthesia Plan: General   Post-op Pain Management:    Induction: Intravenous  PONV Risk Score and Plan: 3 and Propofol infusion and TIVA  Airway Management Planned: Nasal Cannula  Additional Equipment:   Intra-op Plan:   Post-operative Plan:   Informed Consent: I have reviewed the patients History and Physical, chart, labs and discussed the procedure including the risks, benefits and alternatives for the proposed anesthesia with the patient or authorized representative who has indicated his/her understanding and acceptance.       Plan Discussed with:   Anesthesia Plan Comments:         Anesthesia Quick Evaluation

## 2021-03-29 NOTE — Transfer of Care (Signed)
Immediate Anesthesia Transfer of Care Note  Patient: Rhonda Williamson  Procedure(s) Performed: COLONOSCOPY WITH PROPOFOL  Patient Location: Endoscopy Unit  Anesthesia Type:General  Level of Consciousness: awake  Airway & Oxygen Therapy: Patient Spontanous Breathing  Post-op Assessment: Report given to RN and Post -op Vital signs reviewed and stable  Post vital signs: Reviewed  Last Vitals:  Vitals Value Taken Time  BP    Temp    Pulse 99 03/29/21 1039  Resp 28 03/29/21 1039  SpO2 93 % 03/29/21 1039  Vitals shown include unvalidated device data.  Last Pain:  Vitals:   03/29/21 0938  TempSrc: Temporal  PainSc: 0-No pain         Complications: No notable events documented.

## 2021-03-29 NOTE — Op Note (Signed)
[facility] Gastroenterology Patient Name: Rhonda Williamson Procedure Date: 03/29/2021 10:08 AM MRN: [id number] Account #: [id number] Date of Birth: November 04, 1965 Admit Type: Outpatient Age: 55 Room: [facility] ENDO ROOM 4 Gender: Female Note Status: Finalized Procedure:             Colonoscopy Indications:           Screening for colorectal malignant neoplasm Providers:             Jonathon Bellows MD, MD Medicines:             Monitored Anesthesia Care Complications:         No immediate complications. Procedure:             Pre-Anesthesia Assessment:                        - Prior to the procedure, a History and Physical was                         performed, and patient medications, allergies and                         sensitivities were reviewed. The patient's tolerance                         of previous anesthesia was reviewed.                        - The risks and benefits of the procedure and the                         sedation options and risks were discussed with the                         patient. All questions were answered and informed                         consent was obtained.                        - ASA Grade Assessment: II - A patient with mild                         systemic disease.                        After obtaining informed consent, the colonoscope was                         passed under direct vision. Throughout the procedure,                         the patient's blood pressure, pulse, and oxygen                         saturations were monitored continuously. The                         Colonoscope was introduced through the anus and  advanced to the the cecum, identified by the                         appendiceal orifice. The colonoscopy was performed                         with ease. The patient tolerated the procedure well.                         The quality of the bowel preparation was excellent. Findings:       The perianal and digital rectal examinations were normal.      A 4 mm polyp was found in the ascending colon. The polyp was sessile.       The polyp was removed with a cold snare. Resection was complete, but the       polyp tissue was not retrieved.      The exam was otherwise without abnormality on direct and retroflexion       views. Impression:            - One 4 mm polyp in the ascending colon, removed with                         a cold snare. Complete resection. Polyp tissue not                         retrieved.                        - The examination was otherwise normal on direct and                         retroflexion views. Recommendation:        - Discharge patient to home (with escort).                        - Resume previous diet.                        - Continue present medications.                        - Repeat colonoscopy in 7 years for surveillance. Procedure Code(s):     --- Professional ---                        [phone], Colonoscopy, flexible; with removal of                         tumor(s), polyp(s), or other lesion(s) by snare                         technique Diagnosis Code(s):     --- Professional ---                        Z12.11, Encounter for screening for malignant neoplasm                         of colon  K63.5, Polyp of colon CPT copyright 2019 American Medical Association. All rights reserved. The codes documented in this report are preliminary and upon coder review may  be revised to meet current compliance requirements. Jonathon Bellows, MD Jonathon Bellows MD, MD 03/29/2021 10:37:11 AM This report has been signed electronically. Number of Addenda: 0 Note Initiated On: 03/29/2021 10:08 AM Scope Withdrawal Time: 0 hours 9 minutes 42 seconds  Total Procedure Duration: 0 hours 11 minutes 56 seconds  Estimated Blood Loss:  Estimated blood loss: none.      [facility]

## 2021-03-29 NOTE — Anesthesia Postprocedure Evaluation (Signed)
Anesthesia Post Note  Patient: PAMILA MENDIBLES  Procedure(s) Performed: COLONOSCOPY WITH PROPOFOL  Patient location during evaluation: Endoscopy Anesthesia Type: General Level of consciousness: awake and alert Pain management: pain level controlled Vital Signs Assessment: post-procedure vital signs reviewed and stable Respiratory status: spontaneous breathing, nonlabored ventilation, respiratory function stable and patient connected to nasal cannula oxygen Cardiovascular status: blood pressure returned to baseline and stable Postop Assessment: no apparent nausea or vomiting Anesthetic complications: no   No notable events documented.   Last Vitals:  Vitals:   03/29/21 1059 03/29/21 1109  BP: (!) 146/69 134/73  Pulse: 87 84  Resp: 20 15  Temp:    SpO2: 99% 100%    Last Pain:  Vitals:   03/29/21 1119  TempSrc:   PainSc: 0-No pain                 Martha Clan

## 2021-03-30 ENCOUNTER — Encounter: Payer: Self-pay | Admitting: Gastroenterology

## 2021-04-08 ENCOUNTER — Telehealth: Payer: Self-pay | Admitting: Family Medicine

## 2021-04-08 NOTE — Telephone Encounter (Signed)
Hi Rhonda Williamson, is there a way we can contact [facility] and check the status of pt getting a sleep study? We submitted an order on 03/24/21 and the patient still has not heard anything.

## 2021-04-08 NOTE — Telephone Encounter (Signed)
Pt called in stating she has had previous conversation about getting a CPAP machine, and has not heard anything from anyone,pt states she is in desperate need for a CPAP, pt requested someone give her a call. Please advise.

## 2021-04-08 NOTE — Telephone Encounter (Signed)
Advised patient as below.  

## 2021-04-09 ENCOUNTER — Other Ambulatory Visit: Payer: Self-pay | Admitting: Family Medicine

## 2021-04-09 DIAGNOSIS — J4520 Mild intermittent asthma, uncomplicated: Secondary | ICD-10-CM

## 2021-04-13 ENCOUNTER — Telehealth: Payer: Self-pay

## 2021-04-13 NOTE — Telephone Encounter (Signed)
Called pt to let her know that we do not have any available appointments at this time and that she could do a virtual visit through [employer].

## 2021-04-13 NOTE — Telephone Encounter (Signed)
Copied from [place] [id number]. Topic: Appointment Scheduling - Scheduling Inquiry for Clinic >> Apr 13, 2021  8:34 AM Alanda Slim E wrote: Reason for CRM: Pt would like an appt for sinus infection this week / pt asked if she can be seen anytime this week after 2pm /please advise

## 2021-06-16 ENCOUNTER — Ambulatory Visit: Payer: PRIVATE HEALTH INSURANCE | Attending: Otolaryngology

## 2021-06-16 DIAGNOSIS — G4733 Obstructive sleep apnea (adult) (pediatric): Secondary | ICD-10-CM | POA: Insufficient documentation

## 2021-06-28 ENCOUNTER — Other Ambulatory Visit: Payer: Self-pay

## 2021-07-01 ENCOUNTER — Telehealth: Payer: Self-pay | Admitting: Family Medicine

## 2021-07-01 NOTE — Telephone Encounter (Signed)
Can you review patient's sleep study results? Former Recruitment consultant pt. Thanks!

## 2021-07-01 NOTE — Telephone Encounter (Signed)
Pt is calling because she received information in MyChart about a sleep study and pt is calling for clarity.  Please advise CB-

## 2021-07-04 NOTE — Telephone Encounter (Signed)
Sleep study shows he has severe sleep apnea. Does he need a new CPAP machine. Can send an order for new CPAP machine if so.

## 2021-07-05 NOTE — Telephone Encounter (Signed)
Pt called in stating she does want a new Cpap machine. And would like a nurse to give her a call to talk more details. Please advise

## 2021-07-06 NOTE — Telephone Encounter (Signed)
I called and advised patient of results. She does not have a preference on where to send the CPAP order.

## 2021-08-02 ENCOUNTER — Other Ambulatory Visit: Payer: Self-pay

## 2021-08-02 ENCOUNTER — Telehealth: Payer: Self-pay | Admitting: Family Medicine

## 2021-08-02 DIAGNOSIS — I10 Essential (primary) hypertension: Secondary | ICD-10-CM

## 2021-08-02 MED ORDER — BENAZEPRIL HCL 20 MG PO TABS: 20.0000 mg | ORAL_TABLET | Freq: Every day | ORAL | 1 refills | Status: DC

## 2021-08-02 MED ORDER — AMLODIPINE BESYLATE 10 MG PO TABS: ORAL_TABLET | ORAL | 3 refills | Status: DC

## 2021-08-02 NOTE — Telephone Encounter (Signed)
[facility] faxed refill request for the following medications:  benazepril (LOTENSIN) 20 MG tablet amLODipine (NORVASC) 10 MG tablet  Last Rx: 02/25/21 LOV: 02/25/21 NOV: 08/04/21 with Dr. Rosanna Randy Please advise. Thanks TNP

## 2021-08-03 ENCOUNTER — Ambulatory Visit: Payer: No Typology Code available for payment source | Admitting: Family Medicine

## 2021-08-04 ENCOUNTER — Ambulatory Visit: Payer: No Typology Code available for payment source | Admitting: Family Medicine

## 2021-08-04 ENCOUNTER — Other Ambulatory Visit: Payer: Self-pay

## 2021-08-04 ENCOUNTER — Encounter: Payer: Self-pay | Admitting: Family Medicine

## 2021-08-04 VITALS — BP 147/80 | HR 78 | Temp 98.2°F | Wt 264.0 lb

## 2021-08-04 DIAGNOSIS — J309 Allergic rhinitis, unspecified: Secondary | ICD-10-CM | POA: Diagnosis not present

## 2021-08-04 DIAGNOSIS — Z6841 Body Mass Index (BMI) 40.0 and over, adult: Secondary | ICD-10-CM

## 2021-08-04 DIAGNOSIS — J302 Other seasonal allergic rhinitis: Secondary | ICD-10-CM

## 2021-08-04 DIAGNOSIS — R232 Flushing: Secondary | ICD-10-CM

## 2021-08-04 DIAGNOSIS — G4733 Obstructive sleep apnea (adult) (pediatric): Secondary | ICD-10-CM | POA: Diagnosis not present

## 2021-08-04 DIAGNOSIS — I10 Essential (primary) hypertension: Secondary | ICD-10-CM | POA: Diagnosis not present

## 2021-08-04 DIAGNOSIS — J4520 Mild intermittent asthma, uncomplicated: Secondary | ICD-10-CM

## 2021-08-04 MED ORDER — FLUTICASONE PROPIONATE 50 MCG/ACT NA SUSP: NASAL | 5 refills | Status: DC

## 2021-08-04 MED ORDER — BENAZEPRIL HCL 40 MG PO TABS: 40.0000 mg | ORAL_TABLET | Freq: Every day | ORAL | 3 refills | Status: DC

## 2021-08-04 MED ORDER — LORATADINE 10 MG PO TABS: 10.0000 mg | ORAL_TABLET | Freq: Every day | ORAL | 3 refills | Status: DC

## 2021-08-04 NOTE — Progress Notes (Signed)
Established patient visit   Patient: Rhonda Williamson   DOB: [birth year]   55 y.o. Female  MRN: [id number] Visit Date: 08/04/2021  Today's healthcare provider: Wilhemena Durie, MD   Chief Complaint  Patient presents with   Follow-up   Hypertension   Subjective    HPI  Patient is delightful 55 year old female.  This is her second marriage.  She has 3 children and 5 grandchildren.  Overall she feels well. Does need an auto titrating CPAP machine to replace her old one. She had a partial hysterectomy at age 28 and is experienced occasional hot flashes. She does have some allergy symptoms. Hypertension, follow-up  BP Readings from Last 3 Encounters:  08/04/21 (!) 147/80  03/29/21 134/73  03/25/21 115/81   Wt Readings from Last 3 Encounters:  08/04/21 264 lb (119.7 kg)  03/29/21 270 lb (122.5 kg)  03/25/21 270 lb (122.5 kg)     She was last seen for hypertension 5 months ago.  BP at that visit was 139/84. Management since that visit includes; Well controlled and needs refill of meds. Recheck follow up labs. She reports good compliance with treatment. She is not having side effects. none She is exercising. She is adherent to low salt diet.   Outside blood pressures are not checking.  She does not smoke.  Use of agents associated with hypertension: none.   --------------------------------------------------------------------------------------------------- Follow up for Obstructive sleep apnea syndrome:  The patient was last seen for this 5 months ago. Changes made at last visit include; Old machine that is not working well after 10 years of use. Schedule home sleep test to assess need for a newer CPAP that is capable of self-titration.   She reports good compliance with treatment. She feels that condition is Unchanged. She is not having side effects.  none  -----------------------------------------------------------------------------------------     Medications: Outpatient Medications Prior to Visit  Medication Sig   acetaminophen (TYLENOL) 325 MG tablet Take 2 tablets (650 mg total) by mouth every 6 (six) hours as needed for mild pain or headache (fever >/= 101).   albuterol (VENTOLIN HFA) 108 (90 Base) MCG/ACT inhaler INHALE 2 PUFFS INTO THE LUNGS EVERY 6 HOURS AS NEEDED FOR WHEEZING OR SHORTNESS OF BREATH   amLODipine (NORVASC) 10 MG tablet TAKE 1 TABLET BY MOUTH DAILY.   promethazine-dextromethorphan (PROMETHAZINE-DM) 6.25-15 MG/5ML syrup Take 5 mLs by mouth 4 (four) times daily as needed.   [DISCONTINUED] benazepril (LOTENSIN) 20 MG tablet Take 1 tablet (20 mg total) by mouth daily.   [DISCONTINUED] fluticasone (FLONASE) 50 MCG/ACT nasal spray 2 (TWO) SPRAYS IN EACH NOSTRIL AT BEDTIME   No facility-administered medications prior to visit.    Review of Systems  Constitutional:  Negative for appetite change, chills, fatigue and fever.  Respiratory:  Negative for chest tightness and shortness of breath.   Cardiovascular:  Negative for chest pain and palpitations.  Gastrointestinal:  Negative for abdominal pain, nausea and vomiting.  Neurological:  Negative for dizziness and weakness.       Objective    BP (!) 147/80 (BP Location: Right Arm, Patient Position: Sitting, Cuff Size: Large)   Pulse 78   Temp 98.2 F (36.8 C) (Oral)   Wt 264 lb (119.7 kg)   LMP 07/19/1995 (Approximate)   SpO2 98%   BMI 40.14 kg/m  BP Readings from Last 3 Encounters:  08/04/21 (!) 147/80  03/29/21 134/73  03/25/21 115/81   Wt Readings from Last 3 Encounters:  08/04/21 264  lb (119.7 kg)  03/29/21 270 lb (122.5 kg)  03/25/21 270 lb (122.5 kg)      Physical Exam Vitals reviewed.  Constitutional:      General: She is not in acute distress.    Appearance: She is well-developed.  HENT:     Head: Normocephalic and atraumatic.     Right  Ear: Hearing and external ear normal.     Left Ear: Hearing and external ear normal.     Nose: Nose normal.  Eyes:     General: Lids are normal. No scleral icterus.       Right eye: No discharge.        Left eye: No discharge.     Conjunctiva/sclera: Conjunctivae normal.  Cardiovascular:     Rate and Rhythm: Normal rate and regular rhythm.     Heart sounds: Normal heart sounds.  Pulmonary:     Effort: Pulmonary effort is normal. No respiratory distress.  Abdominal:     Palpations: Abdomen is soft.  Musculoskeletal:     Right lower leg: No edema.     Left lower leg: No edema.  Skin:    Findings: No lesion or rash.  Neurological:     General: No focal deficit present.     Mental Status: She is alert and oriented to person, place, and time.  Psychiatric:        Mood and Affect: Mood normal.        Speech: Speech normal.        Behavior: Behavior normal.        Thought Content: Thought content normal.        Judgment: Judgment normal.      No results found for any visits on 08/04/21.  Assessment & Plan     1. Essential (primary) hypertension Increase benazepril from 20 to 40 mg daily.  Follow-up in a few months.   - benazepril (LOTENSIN) 40 MG tablet; Take 1 tablet (40 mg total) by mouth daily.  Dispense: 90 tablet; Refill: 3  2. Obstructive sleep apnea syndrome Try to get auto titrating CPAP machine to replace her old 1  3. Allergic rhinitis, unspecified seasonality, unspecified trigger Claritin daily - loratadine (CLARITIN) 10 MG tablet; Take 1 tablet (10 mg total) by mouth daily.  Dispense: 90 tablet; Refill: 3  4. Seasonal allergic rhinitis, unspecified trigger  - fluticasone (FLONASE) 50 MCG/ACT nasal spray; 2 (TWO) SPRAYS IN EACH NOSTRIL AT BEDTIME  Dispense: 16 g; Refill: 5  5. Hot flashes These are tolerable for now.  6. Mild intermittent asthma without complication Butyryl as needed.  7. Class 3 severe obesity due to excess calories with serious  comorbidity and body mass index (BMI) of 40.0 to 44.9 in adult [facility]) Diet and exercise habits discussed with some weight loss   Return in about 6 months (around 02/01/2022).      I, Wilhemena Durie, MD, have reviewed all documentation for this visit. The documentation on 08/10/21 for the exam, diagnosis, procedures, and orders are all accurate and complete.    Jaymeson Mengel Cranford Mon, MD  [facility] [phone] (phone) [phone] (fax)  [place]

## 2021-11-17 ENCOUNTER — Telehealth: Payer: Self-pay | Admitting: Family Medicine

## 2021-11-17 NOTE — Telephone Encounter (Signed)
Pt is calling to let Daneil Dan know that her husband.is coming to [facility] to drop off an email from [place]. Pt is needing a script for her CPAP. Pt has already completed the sleep study. Pt is also needing the levels that the machine needs to be set on. Sleep study was completed 09/15 16, 2022.  Time recording where sent from [place].  CB- Pt is requesting a callback [phone]

## 2021-11-18 NOTE — Telephone Encounter (Addendum)
Patient saw Dr. Rosanna Randy for OSA on  08/31/21 in his visit notes he states " try to get auto titraing CPAP machine to replace her old one." In patient message below it states that patient has already completed sleep study and requesting PCP give script for CPAP. Please review. KW

## 2021-11-18 NOTE — Telephone Encounter (Signed)
Pt calling in to check on her CPAP supplies, pt requested to speak directly with [facility].

## 2021-11-18 NOTE — Telephone Encounter (Signed)
Pt is checking her email to see if she can figure out where they are wanting her to have prescription sent.  She will call back with more information.  The paperwork is on my desk.

## 2021-11-18 NOTE — Telephone Encounter (Signed)
Per patient's request I have faxed the order that is in her chart from [facility] [place] from 02/25/2021 to [phone].

## 2021-12-06 ENCOUNTER — Telehealth: Payer: Self-pay

## 2021-12-06 NOTE — Telephone Encounter (Signed)
Copied from [place] [id number]. Topic: General - Other ?>> Dec 06, 2021  7:55 AM Valere Dross wrote: ?Reason for CRM: Pt called in stating if a nurse could give them a call back, she wanted to talk to PCP nurse about her pharmacy, and some medication changes she had, please advise. ?

## 2021-12-06 NOTE — Telephone Encounter (Signed)
Pt return our call. Connection lost - called pt back. ? ?Pt would like to change pharmacies from [facility] to [facility]. ? ?[facility] :[facility], [address], [place] /[phone]. ? ?Changed in chart to new pharmacy. ?

## 2021-12-26 ENCOUNTER — Other Ambulatory Visit: Payer: Self-pay | Admitting: Physician Assistant

## 2021-12-26 ENCOUNTER — Other Ambulatory Visit: Payer: Self-pay

## 2021-12-26 ENCOUNTER — Encounter: Payer: Self-pay | Admitting: Physician Assistant

## 2021-12-26 ENCOUNTER — Ambulatory Visit: Payer: BC Managed Care – PPO | Admitting: Physician Assistant

## 2021-12-26 VITALS — BP 136/92 | HR 78 | Temp 98.7°F | Resp 16 | Ht 68.0 in | Wt 266.8 lb

## 2021-12-26 DIAGNOSIS — J302 Other seasonal allergic rhinitis: Secondary | ICD-10-CM | POA: Diagnosis not present

## 2021-12-26 DIAGNOSIS — H9201 Otalgia, right ear: Secondary | ICD-10-CM | POA: Diagnosis not present

## 2021-12-26 MED ORDER — CIPROFLOXACIN-HYDROCORTISONE 0.2-1 % OT SUSP: 3.0000 [drp] | Freq: Two times a day (BID) | OTIC | 0 refills | Status: DC

## 2021-12-26 MED ORDER — CELECOXIB 200 MG PO CAPS: 200.0000 mg | ORAL_CAPSULE | Freq: Two times a day (BID) | ORAL | 0 refills | Status: DC

## 2021-12-26 NOTE — Progress Notes (Signed)
?  ? ?I,Augusta Hilbert Robinson,acting as a Education administrator for [employer], PA-C.,have documented all relevant documentation on the behalf of Mardene Speak, PA-C,as directed by  [employer], PA-C while in the presence of [employer], PA-C.  ? ?Established patient visit ? ? ?Patient: Rhonda Williamson   DOB: 1966/08/31   56 y.o. Female  MRN: [id number] ?Visit Date: 12/26/2021 ? ?Today's healthcare provider: Mardene Speak, PA-C  ? ?Chief Complaint  ?Patient presents with  ? Ear Pain  ? ?Subjective  ?  ?Otalgia  ?There is pain in the right ear. This is a new problem. The current episode started yesterday (pain starting in right jaw and went to right ear and "all in sinus area" and chin line). The problem occurs hourly. The problem has been unchanged. There has been no fever. The pain is at a severity of 10/10 (when acting up). The pain is severe (at times). Pertinent negatives include no abdominal pain, coughing, diarrhea, ear discharge, headaches, hearing loss, neck pain, rash, rhinorrhea, sore throat or vomiting. Treatments tried: amoxicillin / Tylenol / oxycodone. The treatment provided mild relief. Her past medical history is significant for a tympanostomy tube. There is no history of a chronic ear infection or hearing loss.   ?Pt was seen at Urgent Care yesterday ? and given Rx for Amoxicillin.    ?Patient states that she took antibiotic for 3.5 days. Patient asked if it is possible to switch her current antibiotic to a different antibiotic; she endorses ear pain, pruritus and asked if there is a pain medication to help with this pain.  She could not sleep last night because of pain; pain is sharp, throbbing.  She feels like her gums are sore and inflamed. 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  Has seasonal allergies, has a sleep apnea and using CPAP machine ? ?Denies having sob, palpitation, chest pain neck pain ?Denies previous ear infections, any prior ear surgery  or radiation, chronic skin conditions, recent ear instrumentation, use of devices in the ear canal, and water exposure.  ? ?Former smoker, quit in 2004. ? ?Medications: ?Outpatient Medications Prior to Visit  ?Medication Sig  ? acetaminophen (TYLENOL) 325 MG tablet Take 2 tablets (650 mg total) by mouth every 6 (six) hours as needed for mild pain or headache (fever >/= 101).  ? albuterol (VENTOLIN HFA) 108 (90 Base) MCG/ACT inhaler INHALE 2 PUFFS INTO THE LUNGS EVERY 6 HOURS AS NEEDED FOR WHEEZING OR SHORTNESS OF BREATH  ? amLODipine (NORVASC) 10 MG tablet TAKE 1 TABLET BY MOUTH DAILY.  ? benazepril (LOTENSIN) 40 MG tablet Take 1 tablet (40 mg total) by mouth daily.  ? fluticasone (FLONASE) 50 MCG/ACT nasal spray 2 (TWO) SPRAYS IN EACH NOSTRIL AT BEDTIME  ? loratadine (CLARITIN) 10 MG tablet Take 1 tablet (10 mg total) by mouth daily.  ? promethazine-dextromethorphan (PROMETHAZINE-DM) 6.25-15 MG/5ML syrup Take 5 mLs by mouth 4 (four) times daily as needed.  ? ?No facility-administered medications prior to visit.  ? ? ?Review of Systems  ?HENT:  Positive for ear pain. Negative for ear discharge, hearing loss, rhinorrhea and sore throat.   ?Respiratory:  Negative for cough.   ?Gastrointestinal:  Negative for abdominal pain, diarrhea and vomiting.  ?Musculoskeletal:  Negative for neck pain.  ?Skin:  Negative for rash.  ?Neurological:  Negative for headaches.  ? ?See HPI ? ?  Objective  ?  ?BP (!) 136/92 (BP Location: Left Arm, Cuff Size: Large)  Pulse 78   Temp 98.7 ?F (37.1 ?C) (Oral)   Resp 16   Ht '5\' 8"'$  (1.727 m)   Wt 266 lb 12.8 oz (121 kg)   LMP 07/19/1995 (Approximate)   SpO2 99%   BMI 40.57 kg/m?  ? ?Physical Exam ?Vitals and nursing note reviewed.  ?Constitutional:   ?   General: She is in acute distress.  ?   Appearance: Normal appearance. She is obese.  ?HENT:  ?   Head: Normocephalic and atraumatic.  ?   Ears:  ?   Comments: Tenderness with tragal pressure or when the auricle is manipulated or  pulled  ? ?The ear canal appears edematous and erythematous  with debris . The tympanic membrane erythematous and only partially visible due to canal edema  ? ?   Nose: Congestion and rhinorrhea present.  ?   Mouth/Throat:  ?   Mouth: Mucous membranes are moist.  ?   Pharynx: Oropharynx is clear. No oropharyngeal exudate or posterior oropharyngeal erythema.  ?Eyes:  ?   Pupils: Pupils are equal, round, and reactive to light.  ?Cardiovascular:  ?   Rate and Rhythm: Normal rate.  ?   Pulses: Normal pulses.  ?Pulmonary:  ?   Effort: Pulmonary effort is normal.  ?Neurological:  ?   General: No focal deficit present.  ?   Mental Status: She is alert and oriented to person, place, and time.  ?Psychiatric:     ?   Behavior: Behavior normal.     ?   Thought Content: Thought content normal.     ?   Judgment: Judgment normal.  ?  ? ? ?No results found for any visits on 12/26/21. ? Assessment & Plan  ?  ? ?1. Right ear pain ?Suspected 2/2 otitis externa ?- celecoxib (CELEBREX) 200 MG capsule; Take 1 capsule (200 mg total) by mouth 2 (two) times daily.  Dispense: 60 capsule; Refill: 0 ?- ciprofloxacin-hydrocortisone (CIPRO HC OTIC) OTIC suspension; Place 3 drops into both ears 2 (two) times daily.  Dispense: 10 mL; Refill: 0 ?Patient prefers not to use otic suspension at this time. ?- Continue antibiotic. Call if there will be no improvement in 2-3 days. ?Might consider a different antibiotic ? ?2. Seasonal allergic rhinitis, unspecified trigger ?- continue with current treatment. ?   ?Fu in 10 days to reassess her symptoms. ? ?The patient was advised to call back or seek an in-person evaluation if the symptoms worsen or if the condition fails to improve as anticipated. ? ?I discussed the assessment and treatment plan with the patient. The patient was provided an opportunity to ask questions and all were answered. The patient agreed with the plan and demonstrated an understanding of the instructions. ? ?The entirety of the  information documented in the History of Present Illness, Review of Systems and Physical Exam were personally obtained by me. Portions of this information were initially documented by the CMA and reviewed by me for thoroughness and accuracy.   ? ? ?Mardene Speak, PA-C  ?[place] ?[phone] (phone) ?[phone] (fax) ? ?[facility] ?

## 2021-12-27 ENCOUNTER — Telehealth: Payer: Self-pay | Admitting: Family Medicine

## 2021-12-27 ENCOUNTER — Other Ambulatory Visit: Payer: Self-pay

## 2021-12-27 DIAGNOSIS — J4520 Mild intermittent asthma, uncomplicated: Secondary | ICD-10-CM

## 2021-12-27 MED ORDER — ALBUTEROL SULFATE HFA 108 (90 BASE) MCG/ACT IN AERS: 2.0000 | INHALATION_SPRAY | Freq: Four times a day (QID) | RESPIRATORY_TRACT | 2 refills | Status: DC | PRN

## 2021-12-27 NOTE — Telephone Encounter (Signed)
Rx sent to pharmacy   

## 2021-12-27 NOTE — Telephone Encounter (Signed)
[facility] faxed refill request for the following medications: ? ?albuterol (VENTOLIN HFA) 108 (90 Base) MCG/ACT inhaler ? ? ?Please advise ? ?

## 2021-12-30 ENCOUNTER — Other Ambulatory Visit: Payer: Self-pay | Admitting: Physician Assistant

## 2021-12-30 ENCOUNTER — Ambulatory Visit: Payer: Self-pay | Admitting: *Deleted

## 2021-12-30 NOTE — Telephone Encounter (Signed)
? ?  Chief Complaint: ears still infected ?Symptoms: pain ?Frequency: always ?Pertinent Negatives: Patient denies fever ?Disposition: '[]'$ ED /'[]'$ Urgent Care (no appt availability in office) / '[]'$ Appointment(In office/virtual)/ '[]'$  [facility]/ '[]'$ Home Care/ '[]'$ Refused Recommended Disposition /'[]'$ [facility]/ '[x]'$  Follow-up with PCP ?Additional Notes: Seen 12/26/21 OV note states to call back if antibiotic not working, It is not. She did not pick up ear gtts ordered, but will if MD does not think there is another antibiotic to take by mouth, she is concerned about cost of ear drops but will get if has to. Requests call back. Has follow up appt scheduled but not until next week. ?Reason for Disposition ? [1] Reasonable improvement on antibiotic AND [2] no fever or pain ? ?Answer Assessment - Initial Assessment Questions ?1. ANTIBIOTIC: "What antibiotic are you taking?" "How many times per day?" ?    Amoxicillin from UC doctor ?2. ONSET: "When was the antibiotic started?" ?    One week ?3. LOCATION: "Which ear is involved?" ?    Both ears, right worse than left ?4. PAIN: "How bad is the pain?"   (Scale 1-10; mild, moderate or severe) ?  - MILD (1-3): doesn't interfere with normal activities  ?  - MODERATE (4-7): interferes with normal activities or awakens from sleep  ?  - SEVERE (8-10): excruciating pain, unable to do any normal activities  ?    7 ?5. FEVER: "Do you have a fever?" If Yes, ask: "What is your temperature, how was it measured, and when did it start?" ?    no ?6. DISCHARGE: "Is there any discharge from the ear?" ?    no ?7. OTHER SYMPTOMS: "Do you have any other symptoms?" (e.g., headache, stiff neck, dizziness, vomiting, runny nose) ?    no ?8. PREGNANCY: "Is there any chance you are pregnant?" "When was your last menstrual period?" ?    na ? ?Protocols used: Ear - Otitis Media Follow-up Call-A-AH ? ?

## 2021-12-31 ENCOUNTER — Other Ambulatory Visit: Payer: Self-pay | Admitting: Physician Assistant

## 2022-01-02 ENCOUNTER — Encounter: Payer: Self-pay | Admitting: Physician Assistant

## 2022-01-02 ENCOUNTER — Other Ambulatory Visit: Payer: Self-pay | Admitting: Physician Assistant

## 2022-01-02 DIAGNOSIS — H9201 Otalgia, right ear: Secondary | ICD-10-CM

## 2022-01-02 MED ORDER — OFLOXACIN 0.3 % OT SOLN: 5.0000 [drp] | Freq: Two times a day (BID) | OTIC | 0 refills | Status: DC

## 2022-01-02 NOTE — Progress Notes (Signed)
Instill 10 drops into affected ear(s) once daily for 7 days.  ?

## 2022-01-04 ENCOUNTER — Ambulatory Visit (INDEPENDENT_AMBULATORY_CARE_PROVIDER_SITE_OTHER): Payer: BC Managed Care – PPO | Admitting: Physician Assistant

## 2022-01-04 VITALS — BP 147/69 | HR 86 | Temp 98.3°F | Wt 266.0 lb

## 2022-01-04 DIAGNOSIS — H9201 Otalgia, right ear: Secondary | ICD-10-CM

## 2022-01-04 DIAGNOSIS — M792 Neuralgia and neuritis, unspecified: Secondary | ICD-10-CM

## 2022-01-04 MED ORDER — GABAPENTIN 600 MG PO TABS: ORAL_TABLET | ORAL | 1 refills | Status: DC

## 2022-01-04 NOTE — Progress Notes (Signed)
? ?I,Elena D DeSanto,acting as a scribe for [employer], PA-C.,have documented all relevant documentation on the behalf of Mardene Speak, PA-C,as directed by  [employer], PA-C while in the presence of [employer], PA-C. ?  ? ? ?Established patient visit ? ? ?Patient: Rhonda Williamson   DOB: [birth year]   56 y.o. Female  MRN: [id number] ?Visit Date: 01/04/2022 ? ?Today's healthcare provider: Mardene Speak, PA-C  ?CC: ear pain, throbbing pain from ear to jaw line ? ?Subjective  ?  ?HPI  ?Patient is a 56 year old female who presents with ear pain. Patient states that she has been having trouble for 3 weeks.  She has been seen by UC and then by [place]. She was diagnosed with sinusitis and started on drops, Celebrex, Augmentin and Tylenol./Oxycodone  Pain subsided when she started to take antibiotics but returned back after completing a course of antibiotics, she states she is in so much pain she is unable to sleep.  She has been using the heating pad on her face to help alleviate the pain but nothing seems to be helping. Pain is throbbing, sharp, lasted from a few seconds to a few minutes'.  Patient states her face is swelling and she now has a headache that does not seem to subside completely.At the same time, she believes it could be due to lack of regular sleep ? ?She states that the right side of her head is sore, swollen and painful.  She reports having ear pain in both ears now, but R ear > L ear. She continue to endorse having ear pain with touch of the outside of the earlobe close to ear canal and itchiness inside the canal ?Patient states that a pimple underneath of her right jaw as a very sore spot and could be a culprit of some of her facial pain. ? ?Patient reports that she saw her dentist recently and she has no new dental problems  ? ?Patient endorses having seasonal allergic rhinitis, mild intermittent asthma.Taking Flonase and Butyryl, ?Claritin  ? ?Denies problems with chewing, swallowing, vision loss,  temporal headache. ?Balance or coordination or muscle weakness, nausea, vomiting, dizziness or hearing loss. ? ?Denies having night sweats, fever, fatigue ?Of note, her PMHx is not significant for a tympanostomy tube. ? ?Patient has a hx of recurrent frontal sinusitis and she did well on amoxicillin. ? ?Medications: ?Outpatient Medications Prior to Visit  ?Medication Sig  ? acetaminophen (TYLENOL) 325 MG tablet Take 2 tablets (650 mg total) by mouth every 6 (six) hours as needed for mild pain or headache (fever >/= 101).  ? albuterol (VENTOLIN HFA) 108 (90 Base) MCG/ACT inhaler Inhale 2 puffs into the lungs every 6 (six) hours as needed for wheezing or shortness of breath.  ? amLODipine (NORVASC) 10 MG tablet TAKE 1 TABLET BY MOUTH DAILY.  ? benazepril (LOTENSIN) 40 MG tablet Take 1 tablet (40 mg total) by mouth daily.  ? celecoxib (CELEBREX) 200 MG capsule Take 1 capsule (200 mg total) by mouth 2 (two) times daily.  ? fluticasone (FLONASE) 50 MCG/ACT nasal spray 2 (TWO) SPRAYS IN EACH NOSTRIL AT BEDTIME  ? loratadine (CLARITIN) 10 MG tablet Take 1 tablet (10 mg total) by mouth daily.  ? ofloxacin (FLOXIN OTIC) 0.3 % OTIC solution Place 5 drops into the right ear 2 (two) times daily.  ? ?No facility-administered medications prior to visit.  ? ? ?Review of Systems  ?Constitutional:  Negative for fever.  ?HENT:  Positive for ear pain, facial  swelling and sore throat. Negative for ear discharge, hearing loss, postnasal drip, rhinorrhea, sinus pressure, sinus pain, sneezing, tinnitus, trouble swallowing and voice change.   ?Respiratory:  Negative for cough, shortness of breath and wheezing.   ?Cardiovascular:  Negative for chest pain.  ?Musculoskeletal:  Positive for neck pain (jawline).  ?Neurological:  Positive for headaches. Negative for facial asymmetry, speech difficulty, weakness, light-headedness and numbness.  ? ?  Objective  ?  ?BP (!) 147/69 (BP Location: Right Arm, Patient Position: Sitting, Cuff Size: Large)    Pulse 86   Temp 98.3 ?F (36.8 ?C) (Oral)   Wt 266 lb (120.7 kg)   LMP 07/19/1995 (Approximate)   SpO2 99%   BMI 40.45 kg/m?  ? ? ?Physical Exam ?Vitals and nursing note reviewed.  ?Constitutional:   ?   Appearance: Normal appearance.  ?HENT:  ?   Head: Normocephalic and atraumatic.  ?   Ears:  ?   Comments: Fluids behind TM, injected TM on the R ?   Nose: Congestion present. No rhinorrhea.  ?Eyes:  ?   Extraocular Movements: Extraocular movements intact.  ?   Conjunctiva/sclera: Conjunctivae normal.  ?   Pupils: Pupils are equal, round, and reactive to light.  ?Musculoskeletal:  ?   Cervical back: Tenderness present. No rigidity.  ?Lymphadenopathy:  ?   Cervical: No cervical adenopathy.  ?Neurological:  ?   General: No focal deficit present.  ?   Mental Status: She is alert and oriented to person, place, and time.  ?  ? ? Assessment & Plan  ?  ? ?1. Neuropathic pain ?Suspected trigeminal neuralgia vs unilateral neuralgiform headache, cluster-tic syndrome, primary stabbing headache, painful trigeminal neuropathy. ?Brief paroxysms of 10/10 pain in distribution of trigeminal nerve ?Frequency of paroxysms increased. ?- gabapentin (NEURONTIN) 600 MG tablet; Take 1/2 tablets daily - 1 st day, then 1 tablet daily - 2 day, then 1 and 1/2 daily -  Dispense: 15 tablet; Refill: 1 ?- Ambulatory referral to Neurology ? ?2. Right ear pain ?Suspected trigeminal neuralgia, less ear infection, TMJ, neuropathy, geniculate neuralgia  ?H&N malignancies ?-gabapentin (NEURONTIN) 600 MG tablet; Take 1/2 tablets daily - 1 st day, then 1 tablet daily - 2 day, then 1 and 1/2 daily -  Dispense: 15 tablet; Refill: 1 ?-Ambulatory referral to ENT ?   ?The patient was advised to call back or seek an in-person evaluation if the symptoms worsen or if the condition fails to improve as anticipated. ? ?I discussed the assessment and treatment plan with the patient. The patient was provided an opportunity to ask questions and all were answered.  The patient agreed with the plan and demonstrated an understanding of the instructions. ? ?The entirety of the information documented in the History of Present Illness, Review of Systems and Physical Exam were personally obtained by me. Portions of this information were initially documented by the CMA and reviewed by me for thoroughness and accuracy.   ? ? ?Mardene Speak, PA-C  ?[place] ?[phone] (phone) ?[phone] (fax) ? ?[facility] ?

## 2022-01-05 ENCOUNTER — Encounter: Payer: Self-pay | Admitting: Physician Assistant

## 2022-01-05 ENCOUNTER — Ambulatory Visit: Payer: BC Managed Care – PPO | Admitting: Physician Assistant

## 2022-01-16 ENCOUNTER — Other Ambulatory Visit: Payer: Self-pay | Admitting: Family Medicine

## 2022-01-17 ENCOUNTER — Other Ambulatory Visit: Payer: Self-pay | Admitting: Physician Assistant

## 2022-01-17 DIAGNOSIS — M792 Neuralgia and neuritis, unspecified: Secondary | ICD-10-CM

## 2022-01-22 ENCOUNTER — Other Ambulatory Visit: Payer: Self-pay | Admitting: Physician Assistant

## 2022-01-22 DIAGNOSIS — H9201 Otalgia, right ear: Secondary | ICD-10-CM

## 2022-01-23 NOTE — Telephone Encounter (Signed)
Take this medications with meal  As needed ?

## 2022-01-26 ENCOUNTER — Other Ambulatory Visit: Payer: Self-pay

## 2022-01-26 ENCOUNTER — Telehealth: Payer: Self-pay | Admitting: Family Medicine

## 2022-01-26 DIAGNOSIS — I10 Essential (primary) hypertension: Secondary | ICD-10-CM

## 2022-01-26 MED ORDER — BENAZEPRIL HCL 40 MG PO TABS: 40.0000 mg | ORAL_TABLET | Freq: Every day | ORAL | 3 refills | Status: DC

## 2022-01-26 NOTE — Telephone Encounter (Signed)
[facility] faxed refill request for the following medications: ? ? ?benazepril (LOTENSIN) 40 MG tablet  ? ?Please advise. ? ?

## 2022-01-31 ENCOUNTER — Other Ambulatory Visit: Payer: Self-pay | Admitting: *Deleted

## 2022-01-31 DIAGNOSIS — I10 Essential (primary) hypertension: Secondary | ICD-10-CM

## 2022-01-31 MED ORDER — BENAZEPRIL HCL 40 MG PO TABS: 40.0000 mg | ORAL_TABLET | Freq: Every day | ORAL | 3 refills | Status: DC

## 2022-02-01 ENCOUNTER — Ambulatory Visit (INDEPENDENT_AMBULATORY_CARE_PROVIDER_SITE_OTHER): Payer: Self-pay | Admitting: Family Medicine

## 2022-02-01 ENCOUNTER — Ambulatory Visit: Payer: No Typology Code available for payment source | Admitting: Family Medicine

## 2022-02-01 ENCOUNTER — Encounter: Payer: Self-pay | Admitting: Family Medicine

## 2022-02-01 VITALS — BP 144/82 | HR 85 | Temp 98.9°F | Resp 16 | Wt 255.3 lb

## 2022-02-01 DIAGNOSIS — G4733 Obstructive sleep apnea (adult) (pediatric): Secondary | ICD-10-CM

## 2022-02-01 DIAGNOSIS — I10 Essential (primary) hypertension: Secondary | ICD-10-CM

## 2022-02-01 DIAGNOSIS — Z6841 Body Mass Index (BMI) 40.0 and over, adult: Secondary | ICD-10-CM

## 2022-02-01 DIAGNOSIS — E66813 Obesity, class 3: Secondary | ICD-10-CM

## 2022-02-01 DIAGNOSIS — J309 Allergic rhinitis, unspecified: Secondary | ICD-10-CM

## 2022-02-01 MED ORDER — CETIRIZINE HCL 10 MG PO TABS: 10.0000 mg | ORAL_TABLET | Freq: Every day | ORAL | 11 refills | Status: DC

## 2022-02-01 NOTE — Progress Notes (Signed)
?  ? ?I,Jana Robinson,acting as a scribe for Wilhemena Durie, MD.,have documented all relevant documentation on the behalf of Wilhemena Durie, MD,as directed by  Wilhemena Durie, MD while in the presence of Wilhemena Durie, MD ? ?Established patient visit ? ? ?Patient: Rhonda Williamson   DOB: 11-02-1965   56 y.o. Female  MRN: [id number] ?Visit Date: 02/01/2022 ? ?Today's healthcare provider: Wilhemena Durie, MD  ? ?Chief Complaint  ?Patient presents with  ? Hypertension  ? ?Subjective  ?  ?Hypertension, follow-up ? ?Patient comes in today for follow-up.  She states she is feels well.  She states she is waiting on auto CPAP. ?Her allergies are still an issue.  She is on loratadine. ?She is worked on diet and exercise and has lost 11 pounds since her last visit. ?BP Readings from Last 3 Encounters:  ?02/01/22 (!) 144/82  ?01/04/22 (!) 147/69  ?12/26/21 (!) 136/92  ? Wt Readings from Last 3 Encounters:  ?02/01/22 255 lb 4.8 oz (115.8 kg)  ?01/04/22 266 lb (120.7 kg)  ?12/26/21 266 lb 12.8 oz (121 kg)  ?  ? ?She was last seen for hypertension 6 months ago.  ?BP at that visit was 147 80. Management since that visit includes: Increase benazepril from 20 to 40 mg daily.. ? ?She reports excellent compliance with treatment. ?She is not having side effects. ?She is following a Regular diet. ?She is exercising. ?She does not smoke. ? ?Use of agents associated with hypertension: none.  ? ?Outside blood pressures are: pt does not check  ?Symptoms: ?No chest pain No chest pressure  ?No palpitations No syncope  ?No dyspnea No orthopnea  ?No paroxysmal nocturnal dyspnea No lower extremity edema  ? ?Pertinent labs ?Lab Results  ?Component Value Date  ? CHOL 130 03/01/2021  ? HDL 43 03/01/2021  ? [place] 69 03/01/2021  ? TRIG 93 03/01/2021  ? CHOLHDL 3.0 03/01/2021  ? Lab Results  ?Component Value Date  ? NA 147 (H) 03/01/2021  ? K 4.2 03/01/2021  ? CREATININE 0.66 03/01/2021  ? EGFR 103 03/01/2021  ? GLUCOSE 103 (H)  03/01/2021  ? TSH 1.440 03/01/2021  ?  ? ?The 10-year ASCVD risk score (Arnett DK, et al., 2019) is: 6.7% ? ?--------------------------------------------------------------------------------------------------- ?Patient still have not received CPAP machine.  ? ?Patient is a 56 year old female who presents for follow up of recurrent/persistent ear pain. ?She was last seen on 01/04/22.  Management at that time included referral to Neurology and ENT.  She was also prescribed Neurontin 600. ? ?When attempt was made to get patient appointment at [facility] she was told that she could not be seen due to a bill.  She then told our referral department she did not want to go outside of [facility] for her appointment.   ? ?Patient was given an appointment for Neurology at [facility] but apparently left the appointment before being seen.  ? ?mg  ?Medications: ?Outpatient Medications Prior to Visit  ?Medication Sig  ? acetaminophen (TYLENOL) 325 MG tablet Take 2 tablets (650 mg total) by mouth every 6 (six) hours as needed for mild pain or headache (fever >/= 101).  ? albuterol (VENTOLIN HFA) 108 (90 Base) MCG/ACT inhaler Inhale 2 puffs into the lungs every 6 (six) hours as needed for wheezing or shortness of breath.  ? amLODipine (NORVASC) 10 MG tablet TAKE 1 TABLET BY MOUTH DAILY.  ? benazepril (LOTENSIN) 40 MG tablet Take 1 tablet (40 mg total)  by mouth daily.  ? celecoxib (CELEBREX) 200 MG capsule TAKE 1 CAPSULE BY MOUTH TWICE A DAY  ? fluticasone (FLONASE) 50 MCG/ACT nasal spray 2 (TWO) SPRAYS IN EACH NOSTRIL AT BEDTIME  ? gabapentin (NEURONTIN) 600 MG tablet TAKE 1/2 TABLET BY MOUTH DAILY FOR 1 DAY THEN 1 TABLET DAILY FOR 1 DAY THEN 1 1/2 TABLETS DAILY  ? loratadine (CLARITIN) 10 MG tablet Take 1 tablet (10 mg total) by mouth daily.  ? ofloxacin (FLOXIN OTIC) 0.3 % OTIC solution Place 5 drops into the right ear 2 (two) times daily.  ? ?No facility-administered medications prior to visit.  ? ? ?Review of Systems ? ?Last  thyroid functions ?Lab Results  ?Component Value Date  ? TSH 1.440 03/01/2021  ? ?  ?  Objective  ?  ?BP (!) 144/82 (BP Location: Right Arm, Patient Position: Sitting, Cuff Size: Normal)   Pulse 85   Temp 98.9 ?F (37.2 ?C) (Oral)   Resp 16   Wt 255 lb 4.8 oz (115.8 kg)   LMP 07/19/1995 (Approximate)   SpO2 96%   BMI 38.82 kg/m?  ?BP Readings from Last 3 Encounters:  ?02/01/22 (!) 144/82  ?01/04/22 (!) 147/69  ?12/26/21 (!) 136/92  ? ?Wt Readings from Last 3 Encounters:  ?02/01/22 255 lb 4.8 oz (115.8 kg)  ?01/04/22 266 lb (120.7 kg)  ?12/26/21 266 lb 12.8 oz (121 kg)  ? ?  ? ?Physical Exam ?Vitals reviewed.  ?Constitutional:   ?   General: She is not in acute distress. ?   Appearance: She is well-developed.  ?HENT:  ?   Head: Normocephalic and atraumatic.  ?   Right Ear: Hearing and external ear normal.  ?   Left Ear: Hearing and external ear normal.  ?   Nose: Nose normal.  ?Eyes:  ?   General: Lids are normal. No scleral icterus.    ?   Right eye: No discharge.     ?   Left eye: No discharge.  ?   Conjunctiva/sclera: Conjunctivae normal.  ?Cardiovascular:  ?   Rate and Rhythm: Normal rate and regular rhythm.  ?   Heart sounds: Normal heart sounds.  ?Pulmonary:  ?   Effort: Pulmonary effort is normal. No respiratory distress.  ?Abdominal:  ?   Palpations: Abdomen is soft.  ?Musculoskeletal:  ?   Right lower leg: No edema.  ?   Left lower leg: No edema.  ?Skin: ?   Findings: No lesion or rash.  ?Neurological:  ?   General: No focal deficit present.  ?   Mental Status: She is alert and oriented to person, place, and time.  ?Psychiatric:     ?   Mood and Affect: Mood normal.     ?   Speech: Speech normal.     ?   Behavior: Behavior normal.     ?   Thought Content: Thought content normal.     ?   Judgment: Judgment normal.  ?  ? ? ?No results found for any visits on 02/01/22. ? Assessment & Plan  ?  ? ?1. Essential (primary) hypertension ?Blood pressure control at home is better.  She states she is about  120/80 ?Continue amlodipine and Lotensin. ?2. Obstructive sleep apnea syndrome ?Auto CPAP between 4 and 20 cm of water ? ?3. Allergic rhinitis, unspecified seasonality, unspecified trigger ?Change loratadine to Zyrtec. ? ?4. Class 3 severe obesity due to excess calories with serious comorbidity and body mass index (BMI) of 40.0 to  44.9 in adult [facility]) ?Continue working on diet and exercise to try to continue to couple of pounds weight loss per month. ? ? ?No follow-ups on file.  ?   ? ?I, Wilhemena Durie, MD, have reviewed all documentation for this visit. The documentation on 02/07/22 for the exam, diagnosis, procedures, and orders are all accurate and complete. ? ? ? ?Richard Cranford Mon, MD  ?[facility] ?[phone] (phone) ?[phone] (fax) ? ?[facility] ?

## 2022-02-06 ENCOUNTER — Telehealth: Payer: Self-pay

## 2022-02-06 NOTE — Telephone Encounter (Signed)
Copied from [place] [id number]. Topic: General - Other ?>> Feb 06, 2022 12:24 PM Pawlus, Brayton Layman A wrote: ?Reason for CRM: Pt stated she was advised to contact the office and request a call back from [place], pt did not give any further info. ?

## 2022-02-08 ENCOUNTER — Other Ambulatory Visit: Payer: Self-pay | Admitting: Family Medicine

## 2022-02-08 DIAGNOSIS — J302 Other seasonal allergic rhinitis: Secondary | ICD-10-CM

## 2022-02-09 NOTE — Telephone Encounter (Signed)
Requested Prescriptions  ?Pending Prescriptions Disp Refills  ?? fluticasone (FLONASE) 50 MCG/ACT nasal spray [Pharmacy Med Name: FLUTICASONE PROP 50 MCG SPRAY] 16 mL 1  ?  Sig: SHAKE LIQUID AND USE 2 SPRAYS IN EACH NOSTRIL AT BEDTIME  ?  ? Ear, Nose, and Throat: Nasal Preparations - Corticosteroids Passed - 02/08/2022  4:37 PM  ?  ?  Passed - Valid encounter within last 12 months  ?  Recent Outpatient Visits   ?      ? 1 week ago Essential (primary) hypertension  ? [facility] Jerrol Banana., MD  ? 1 month ago Neuropathic pain  ? [facility] [place], [place], [place]  ? 1 month ago Right ear pain  ? [facility] [place], [place], PA-C  ? 6 months ago Essential (primary) hypertension  ? [facility] Jerrol Banana., MD  ? 11 months ago Disease of hair and hair follicles  ? [place], PA-C  ?  ?  ?Future Appointments   ?        ? In 3 months Jerrol Banana., MD [facility]  ?  ? ?  ?  ?  ? ? ?

## 2022-02-15 ENCOUNTER — Other Ambulatory Visit: Payer: Self-pay

## 2022-02-15 DIAGNOSIS — Z1231 Encounter for screening mammogram for malignant neoplasm of breast: Secondary | ICD-10-CM

## 2022-02-21 ENCOUNTER — Other Ambulatory Visit: Payer: Self-pay | Admitting: Physician Assistant

## 2022-02-21 ENCOUNTER — Ambulatory Visit: Payer: No Typology Code available for payment source | Attending: Hematology and Oncology | Admitting: *Deleted

## 2022-02-21 ENCOUNTER — Ambulatory Visit
Admission: RE | Admit: 2022-02-21 | Discharge: 2022-02-21 | Disposition: A | Discharge status: Home / Self Care | Payer: No Typology Code available for payment source | Source: Ambulatory Visit | Attending: Obstetrics and Gynecology | Admitting: Obstetrics and Gynecology

## 2022-02-21 ENCOUNTER — Encounter: Payer: Self-pay | Admitting: *Deleted

## 2022-02-21 VITALS — BP 131/83 | HR 87 | Resp 18 | Wt 266.0 lb

## 2022-02-21 DIAGNOSIS — Z01419 Encounter for gynecological examination (general) (routine) without abnormal findings: Secondary | ICD-10-CM

## 2022-02-21 DIAGNOSIS — H9201 Otalgia, right ear: Secondary | ICD-10-CM

## 2022-02-21 DIAGNOSIS — Z1231 Encounter for screening mammogram for malignant neoplasm of breast: Secondary | ICD-10-CM | POA: Insufficient documentation

## 2022-02-21 LAB — HM MAMMOGRAPHY

## 2022-02-21 NOTE — Progress Notes (Signed)
[name] is a 56 y.o. female who presents to [facility] clinic today with no complaints. She is here today for her well woman screening.    Pap Smear: Pap not smear completed today. Patient with a history of hysterectomy due to uterine polyps.  Physical exam: Breasts Breasts symmetrical. No skin abnormalities bilateral breasts. No nipple retraction bilateral breasts. No nipple discharge bilateral breasts. No lymphadenopathy. No lumps palpated bilateral breasts.       Pelvic/Bimanual Pap is not indicated today    Smoking History: Patient has is a former smoker..    Patient Navigation: Patient education provided. Access to services provided for patient through [facility] program. No interpreter provided. No transportation provided   Colorectal Cancer Screening: Per patient has had colonoscopy completed on 03/29/21.  She is to repeat in 7 years.  No complaints today.    Breast and Cervical Cancer Risk Assessment: Patient does not have family history of breast cancer, known genetic mutations, or radiation treatment to the chest before age 63. Patient does not have history of cervical dysplasia, immunocompromised, or DES exposure in-utero.  Risk Assessment     Risk Scores       02/21/2022 02/16/2021   Last edited by: Rhonda Dun, RN Rhonda Junker, RN   5-year risk: 1.4 % 1.4 %   Lifetime risk: 7.5 % 7.7 %            A: BCCCP exam without pap smear  Referred patient to the [facility] for a screening mammogram. Appointment scheduled for today.  Rhonda Junker, RN 02/21/2022 11:18 AM

## 2022-02-22 NOTE — Telephone Encounter (Signed)
Please advise refill? 

## 2022-02-23 ENCOUNTER — Other Ambulatory Visit: Payer: Self-pay | Admitting: Family Medicine

## 2022-02-23 DIAGNOSIS — J302 Other seasonal allergic rhinitis: Secondary | ICD-10-CM

## 2022-02-24 NOTE — Telephone Encounter (Signed)
Done

## 2022-03-20 ENCOUNTER — Telehealth: Payer: Self-pay | Admitting: Family Medicine

## 2022-03-24 ENCOUNTER — Other Ambulatory Visit: Payer: Self-pay | Admitting: *Deleted

## 2022-03-24 DIAGNOSIS — I10 Essential (primary) hypertension: Secondary | ICD-10-CM

## 2022-03-24 MED ORDER — BENAZEPRIL HCL 40 MG PO TABS: 40.0000 mg | ORAL_TABLET | Freq: Every day | ORAL | 3 refills | Status: DC

## 2022-05-15 ENCOUNTER — Encounter: Payer: BC Managed Care – PPO | Admitting: Family Medicine

## 2022-05-15 NOTE — Progress Notes (Deleted)
Complete physical exam   Patient: Rhonda Williamson   DOB: 1965-12-21   56 y.o. Female  MRN: [id number] Visit Date: 05/15/2022  Today's healthcare provider: Wilhemena Durie, MD   No chief complaint on file.  Subjective    Rhonda Williamson is a 56 y.o. female who presents today for a complete physical exam.  She reports consuming a {diet types:17450} diet. {Exercise:19826} She generally feels {well/fairly well/poorly:18703}. She reports sleeping {well/fairly well/poorly:18703}. She {does/does not:200015} have additional problems to discuss today.  HPI    Past Medical History:  Diagnosis Date   Hypertension    Sleep apnea    Past Surgical History:  Procedure Laterality Date   ABDOMINAL HYSTERECTOMY  1998   BUNIONECTOMY  2005   CESAREAN SECTION  1993   COLONOSCOPY WITH PROPOFOL N/A 03/25/2021   Procedure: COLONOSCOPY WITH PROPOFOL;  Surgeon: Jonathon Bellows, MD;  Location: [facility] ENDOSCOPY;  Service: Gastroenterology;  Laterality: N/A;   COLONOSCOPY WITH PROPOFOL N/A 03/29/2021   Procedure: COLONOSCOPY WITH PROPOFOL;  Surgeon: Jonathon Bellows, MD;  Location: [facility] ENDOSCOPY;  Service: Gastroenterology;  Laterality: N/A;   TUBAL LIGATION  1993   Social History   Socioeconomic History   Marital status: Married    Spouse name: Not on file   Number of children: Not on file   Years of education: Not on file   Highest education level: Not on file  Occupational History   Not on file  Tobacco Use   Smoking status: Former   Smokeless tobacco: Never   Tobacco comments:    QUIT IN 2004  Vaping Use   Vaping Use: Never used  Substance and Sexual Activity   Alcohol use: No    Alcohol/week: 0.0 standard drinks of alcohol   Drug use: No   Sexual activity: Not on file  Other Topics Concern   Not on file  Social History Narrative   Not on file   Social Determinants of Health   Financial Resource Strain: Not on file  Food Insecurity: Not on file  Transportation Needs: Not on file   Physical Activity: Not on file  Stress: Not on file  Social Connections: Not on file  Intimate Partner Violence: Not on file   Family Status  Relation Name Status   Mother  Deceased at age 2   Father  Deceased at age 59   Sister 92 Deceased   Brother 41 Alive   Sister 2 Deceased at age 68       died of cancer on 05-11-23   Brother 1 Deceased       ACCIDENTAL DEATH   Brother 2 Deceased   Sister 3 Alive       BREAST CYST   Sister 4 Alive   Neg Hx  (Not Specified)   Family History  Problem Relation Age of Onset   Ovarian cancer Mother    Lung cancer Father    Lung cancer Sister    Bone cancer Sister    CVA Sister    Emphysema Brother    Hypertension Sister    Breast cancer Neg Hx    Allergies  Allergen Reactions   Latex Itching    Patient Care Team: Jerrol Banana., MD as PCP - General (Family Medicine) [place]ann[place], Heath Gold, RN   Medications: Outpatient Medications Prior to Visit  Medication Sig   acetaminophen (TYLENOL) 325 MG tablet Take 2 tablets (650 mg total) by mouth every 6 (six) hours as needed  for mild pain or headache (fever >/= 101).   albuterol (VENTOLIN HFA) 108 (90 Base) MCG/ACT inhaler Inhale 2 puffs into the lungs every 6 (six) hours as needed for wheezing or shortness of breath.   amLODipine (NORVASC) 10 MG tablet TAKE 1 TABLET BY MOUTH DAILY.   benazepril (LOTENSIN) 40 MG tablet Take 1 tablet (40 mg total) by mouth daily.   celecoxib (CELEBREX) 200 MG capsule Take 1 capsule (200 mg total) by mouth daily as needed.   cetirizine (ZYRTEC) 10 MG tablet Take 1 tablet (10 mg total) by mouth daily.   fluticasone (FLONASE) 50 MCG/ACT nasal spray SHAKE LIQUID AND USE 2 SPRAYS IN EACH NOSTRIL AT BEDTIME   gabapentin (NEURONTIN) 600 MG tablet TAKE 1/2 TABLET BY MOUTH DAILY FOR 1 DAY THEN 1 TABLET DAILY FOR 1 DAY THEN 1 1/2 TABLETS DAILY   loratadine (CLARITIN) 10 MG tablet Take 1 tablet (10 mg total) by mouth daily.   ofloxacin (FLOXIN OTIC) 0.3 %  OTIC solution Place 5 drops into the right ear 2 (two) times daily.   No facility-administered medications prior to visit.    Review of Systems  Constitutional: Negative.   HENT: Negative.    Eyes: Negative.   Respiratory: Negative.    Cardiovascular: Negative.   Gastrointestinal: Negative.   Endocrine: Negative.   Genitourinary: Negative.   Musculoskeletal: Negative.   Skin: Negative.   Allergic/Immunologic: Negative.   Neurological: Negative.   Hematological: Negative.   Psychiatric/Behavioral: Negative.      {Labs  Heme  Chem  Endocrine  Serology  Results Review (optional):23779}  Objective    LMP 07/19/1995 (Approximate)  {Show previous vital signs (optional):23777}   Physical Exam Constitutional:      Appearance: Normal appearance. She is normal weight.  HENT:     Head: Normocephalic and atraumatic.     Right Ear: Tympanic membrane, ear canal and external ear normal.     Left Ear: Tympanic membrane, ear canal and external ear normal.     Nose: Nose normal.     Mouth/Throat:     Mouth: Mucous membranes are moist.     Pharynx: Oropharynx is clear.  Eyes:     Extraocular Movements: Extraocular movements intact.     Conjunctiva/sclera: Conjunctivae normal.     Pupils: Pupils are equal, round, and reactive to light.  Cardiovascular:     Rate and Rhythm: Normal rate and regular rhythm.     Pulses: Normal pulses.     Heart sounds: Normal heart sounds.  Pulmonary:     Effort: Pulmonary effort is normal.     Breath sounds: Normal breath sounds.  Abdominal:     General: Abdomen is flat. Bowel sounds are normal.     Palpations: Abdomen is soft.  Musculoskeletal:        General: Normal range of motion.     Cervical back: Normal range of motion and neck supple.  Skin:    General: Skin is warm and dry.  Neurological:     General: No focal deficit present.     Mental Status: She is alert and oriented to person, place, and time. Mental status is at baseline.   Psychiatric:        Mood and Affect: Mood normal.        Behavior: Behavior normal.        Thought Content: Thought content normal.        Judgment: Judgment normal.     ***  Last depression screening scores  02/01/2022    3:46 PM 02/25/2021    3:59 PM 08/29/2019    8:34 AM  PHQ 2/9 Scores  PHQ - 2 Score 0 0 0  PHQ- 9 Score 0 0 0   Last fall risk screening    02/01/2022    3:46 PM  [place] in the past year? 0  Number falls in past yr: 0  Injury with Fall? 0  Follow up Falls evaluation completed   Last Audit-C alcohol use screening    02/01/2022    3:46 PM  Alcohol Use Disorder Test (AUDIT)  1. How often do you have a drink containing alcohol? 1  2. How many drinks containing alcohol do you have on a typical day when you are drinking? 0   A score of 3 or more in women, and 4 or more in men indicates increased risk for alcohol abuse, EXCEPT if all of the points are from question 1   No results found for any visits on 05/15/22.  Assessment & Plan    Routine Health Maintenance and Physical Exam  Exercise Activities and Dietary recommendations  Goals   None     Immunization History  Administered Date(s) Administered   Influenza Split 11/08/2012   Influenza,inj,Quad PF,6+ Mos 07/21/2014, 06/28/2018   Pneumococcal Polysaccharide-23 11/08/2012   Tdap 11/10/2013    Health Maintenance  Topic Date Due   COVID-19 Vaccine (1) Never done   Hepatitis C Screening  Never done   PAP SMEAR-Modifier  10/24/2011   Zoster Vaccines- Shingrix (1 of 2) Never done   INFLUENZA VACCINE  05/02/2022   TETANUS/TDAP  11/11/2023   MAMMOGRAM  02/22/2024   COLONOSCOPY (Pts 45-48yr Insurance coverage will need to be confirmed)  03/30/2031   HIV Screening  Completed   HPV VACCINES  Aged Out    Discussed health benefits of physical activity, and encouraged her to engage in regular exercise appropriate for her age and condition.  ***  No follow-ups on file.      {provider attestation***:1}   RWilhemena Durie MD  [phone](phone) [phone](fax)  [place]

## 2022-09-06 ENCOUNTER — Telehealth: Payer: Self-pay | Admitting: Family Medicine

## 2022-09-06 NOTE — Telephone Encounter (Signed)
[facility] faxed refill request for the following medications:   benazepril (LOTENSIN) 20 MG tablet    Last Rx: LOV:  Please advise.

## 2022-09-08 ENCOUNTER — Other Ambulatory Visit: Payer: Self-pay

## 2022-09-08 DIAGNOSIS — I10 Essential (primary) hypertension: Secondary | ICD-10-CM

## 2022-09-08 MED ORDER — AMLODIPINE BESYLATE 10 MG PO TABS: ORAL_TABLET | ORAL | 3 refills | Status: DC

## 2022-09-08 MED ORDER — BENAZEPRIL HCL 40 MG PO TABS: 40.0000 mg | ORAL_TABLET | Freq: Every day | ORAL | 3 refills | Status: DC

## 2022-09-08 NOTE — Telephone Encounter (Signed)
We received another fax from [place] requesting this medication.  Looks like it might have been sent to [facility] instead.  Send to correct pharmacy

## 2022-09-08 NOTE — Telephone Encounter (Signed)
Done

## 2022-09-13 NOTE — Progress Notes (Signed)
I,[name],acting as a scribe for [employer], MD.,have documented all relevant documentation on the behalf of Eulis Foster, MD,as directed by  Eulis Foster, MD while in the presence of Eulis Foster, MD.   Established patient visit   Patient: Rhonda Williamson   DOB: 07-16-1966   56 y.o. Female  MRN: [id number] Visit Date: 09/14/2022  Today's healthcare provider: Eulis Foster, MD   Chief Complaint  Patient presents with   follow-up chronic disease   Subjective    HPI  Hypertension, follow-up  BP Readings from Last 3 Encounters:  09/14/22 121/79  02/21/22 131/83  02/01/22 (!) 144/82   Wt Readings from Last 3 Encounters:  09/14/22 258 lb 1.6 oz (117.1 kg)  02/21/22 266 lb (120.7 kg)  02/01/22 255 lb 4.8 oz (115.8 kg)     She was last seen for hypertension 6 months ago.  BP at that visit was 131/83. Management since that visit includes Continue amlodipine and Lotensin.  She reports excellent compliance with treatment.  Outside blood pressures are 130's/80's. Symptoms: No chest pain No chest pressure  No palpitations No syncope  No dyspnea No orthopnea  No paroxysmal nocturnal dyspnea No lower extremity edema   Pertinent labs Lab Results  Component Value Date   CHOL 130 03/01/2021   HDL 43 03/01/2021   LDLCALC 69 03/01/2021   TRIG 93 03/01/2021   CHOLHDL 3.0 03/01/2021   Lab Results  Component Value Date   NA 147 (H) 03/01/2021   K 4.2 03/01/2021   CREATININE 0.66 03/01/2021   EGFR 103 03/01/2021   GLUCOSE 103 (H) 03/01/2021   TSH 1.440 03/01/2021     The 10-year ASCVD risk score (Arnett DK, et al., 2019) is: 3.9%  ---------------------------------------------------------------------------------------------------   Medications: Outpatient Medications Prior to Visit  Medication Sig   acetaminophen (TYLENOL) 325 MG tablet Take 2 tablets (650 mg total) by mouth every 6 (six) hours as  needed for mild pain or headache (fever >/= 101).   [DISCONTINUED] albuterol (VENTOLIN HFA) 108 (90 Base) MCG/ACT inhaler Inhale 2 puffs into the lungs every 6 (six) hours as needed for wheezing or shortness of breath.   [DISCONTINUED] amLODipine (NORVASC) 10 MG tablet TAKE 1 TABLET BY MOUTH DAILY.   [DISCONTINUED] benazepril (LOTENSIN) 40 MG tablet Take 1 tablet (40 mg total) by mouth daily.   [DISCONTINUED] cetirizine (ZYRTEC) 10 MG tablet Take 1 tablet (10 mg total) by mouth daily.   [DISCONTINUED] fluticasone (FLONASE) 50 MCG/ACT nasal spray SHAKE LIQUID AND USE 2 SPRAYS IN EACH NOSTRIL AT BEDTIME   [DISCONTINUED] celecoxib (CELEBREX) 200 MG capsule Take 1 capsule (200 mg total) by mouth daily as needed.   [DISCONTINUED] gabapentin (NEURONTIN) 600 MG tablet TAKE 1/2 TABLET BY MOUTH DAILY FOR 1 DAY THEN 1 TABLET DAILY FOR 1 DAY THEN 1 1/2 TABLETS DAILY   [DISCONTINUED] loratadine (CLARITIN) 10 MG tablet Take 1 tablet (10 mg total) by mouth daily.   [DISCONTINUED] ofloxacin (FLOXIN OTIC) 0.3 % OTIC solution Place 5 drops into the right ear 2 (two) times daily.   No facility-administered medications prior to visit.    Review of Systems     Objective    BP 121/79 (BP Location: Left Arm, Patient Position: Sitting, Cuff Size: Large)   Pulse 80   Temp 98.6 F (37 C) (Oral)   Resp 16   Ht _0  (1.727 m)   Wt 258 lb 1.6 oz (117.1 kg)   LMP 07/19/1995 (Approximate)   BMI  39.24 kg/m    Physical Exam Vitals reviewed.  Constitutional:      General: She is not in acute distress.    Appearance: Normal appearance. She is not ill-appearing, toxic-appearing or diaphoretic.  Eyes:     Conjunctiva/sclera: Conjunctivae normal.  Cardiovascular:     Rate and Rhythm: Normal rate and regular rhythm.     Pulses: Normal pulses.     Heart sounds: Normal heart sounds. No murmur heard.    No friction rub. No gallop.  Pulmonary:     Effort: Pulmonary effort is normal. No respiratory distress.      Breath sounds: Normal breath sounds. No stridor. No wheezing, rhonchi or rales.  Abdominal:     General: Bowel sounds are normal. There is no distension.     Palpations: Abdomen is soft.     Tenderness: There is no abdominal tenderness.  Musculoskeletal:     Right lower leg: No edema.     Left lower leg: No edema.  Skin:    Findings: No erythema or rash.  Neurological:     Mental Status: She is alert and oriented to person, place, and time.       No results found for any visits on 09/14/22.   Assessment & Plan     Problem List Items Addressed This Visit       Cardiovascular and Mediastinum   Essential (primary) hypertension - Primary    BP at goal  Stable  Continue current amlodipine 63m daily  and benazepril 42mdaily  Refills provided         Relevant Medications   amLODipine (NORVASC) 10 MG tablet   benazepril (LOTENSIN) 40 MG tablet     Respiratory   Seasonal allergic rhinitis    Chronic, stable  Refills provided for cetirizine and flonase         Relevant Medications   fluticasone (FLONASE) 50 MCG/ACT nasal spray   Other Visit Diagnoses     Mild intermittent asthma without complication       Relevant Medications   albuterol (VENTOLIN HFA) 108 (90 Base) MCG/ACT inhaler        Return in about 6 months (around 03/16/2023) for CPE.     I, MaEulis FosterMD, have reviewed all documentation for this visit.  Portions of this information were initially documented by the CMA and reviewed by me for thoroughness and accuracy.      MaEulis FosterMD  Bu[facility] [phone]) [phone])  [place]

## 2022-09-14 ENCOUNTER — Ambulatory Visit (INDEPENDENT_AMBULATORY_CARE_PROVIDER_SITE_OTHER): Payer: 59 | Admitting: Family Medicine

## 2022-09-14 ENCOUNTER — Encounter: Payer: Self-pay | Admitting: Family Medicine

## 2022-09-14 VITALS — BP 121/79 | HR 80 | Temp 98.6°F | Resp 16 | Ht 68.0 in | Wt 258.1 lb

## 2022-09-14 DIAGNOSIS — J302 Other seasonal allergic rhinitis: Secondary | ICD-10-CM | POA: Diagnosis not present

## 2022-09-14 DIAGNOSIS — J4520 Mild intermittent asthma, uncomplicated: Secondary | ICD-10-CM | POA: Diagnosis not present

## 2022-09-14 DIAGNOSIS — I10 Essential (primary) hypertension: Secondary | ICD-10-CM | POA: Diagnosis not present

## 2022-09-14 MED ORDER — FLUTICASONE PROPIONATE 50 MCG/ACT NA SUSP: NASAL | 3 refills | Status: DC

## 2022-09-14 MED ORDER — BENAZEPRIL HCL 40 MG PO TABS: 40.0000 mg | ORAL_TABLET | Freq: Every day | ORAL | 1 refills | Status: DC

## 2022-09-14 MED ORDER — AMLODIPINE BESYLATE 10 MG PO TABS: ORAL_TABLET | ORAL | 1 refills | Status: DC

## 2022-09-14 MED ORDER — CETIRIZINE HCL 10 MG PO TABS: 10.0000 mg | ORAL_TABLET | Freq: Every day | ORAL | 1 refills | Status: DC

## 2022-09-14 MED ORDER — ALBUTEROL SULFATE HFA 108 (90 BASE) MCG/ACT IN AERS: 2.0000 | INHALATION_SPRAY | Freq: Four times a day (QID) | RESPIRATORY_TRACT | 2 refills | Status: DC | PRN

## 2022-09-14 NOTE — Patient Instructions (Signed)
Recombinant Zoster (Shingles) Vaccine: What You Need to Know 1. Why get vaccinated? Recombinant zoster (shingles) vaccine can prevent shingles. Shingles (also called herpes zoster, or just zoster) is a painful skin rash, usually with blisters. In addition to the rash, shingles can cause fever, headache, chills, or upset stomach. Rarely, shingles can lead to complications such as pneumonia, hearing problems, blindness, brain inflammation (encephalitis), or death. The risk of shingles increases with age. The most common complication of shingles is long-term nerve pain called postherpetic neuralgia (PHN). PHN occurs in the areas where the shingles rash was and can last for months or years after the rash goes away. The pain from PHN can be severe and debilitating. The risk of PHN increases with age. An older adult with shingles is more likely to develop PHN and have longer lasting and more severe pain than a younger person. People with weakened immune systems also have a higher risk of getting shingles and complications from the disease. Shingles is caused by varicella-zoster virus, the same virus that causes chickenpox. After you have chickenpox, the virus stays in your body and can cause shingles later in life. Shingles cannot be passed from one person to another, but the virus that causes shingles can spread and cause chickenpox in someone who has never had chickenpox or has never received chickenpox vaccine. 2. Recombinant shingles vaccine Recombinant shingles vaccine provides strong protection against shingles. By preventing shingles, recombinant shingles vaccine also protects against PHN and other complications. Recombinant shingles vaccine is recommended for: Adults 68 years and older Adults 19 years and older who have a weakened immune system because of disease or treatments Shingles vaccine is given as a two-dose series. For most people, the second dose should be given 2 to 6 months after the first  dose. Some people who have or will have a weakened immune system can get the second dose 1 to 2 months after the first dose. Ask your health care provider for guidance. People who have had shingles in the past and people who have received varicella (chickenpox) vaccine are recommended to get recombinant shingles vaccine. The vaccine is also recommended for people who have already gotten another type of shingles vaccine, the live shingles vaccine. There is no live virus in recombinant shingles vaccine. Shingles vaccine may be given at the same time as other vaccines. 3. Talk with your health care provider Tell your vaccination provider if the person getting the vaccine: Has had an allergic reaction after a previous dose of recombinant shingles vaccine, or has any severe, life-threatening allergies Is currently experiencing an episode of shingles Is pregnant In some cases, your health care provider may decide to postpone shingles vaccination until a future visit. People with minor illnesses, such as a cold, may be vaccinated. People who are moderately or severely ill should usually wait until they recover before getting recombinant shingles vaccine. Your health care provider can give you more information. 4. Risks of a vaccine reaction A sore arm with mild or moderate pain is very common after recombinant shingles vaccine. Redness and swelling can also happen at the site of the injection. Tiredness, muscle pain, headache, shivering, fever, stomach pain, and nausea are common after recombinant shingles vaccine. These side effects may temporarily prevent a vaccinated person from doing regular activities. Symptoms usually go away on their own in 2 to 3 days. You should still get the second dose of recombinant shingles vaccine even if you had one of these reactions after the first dose. Guillain-Barr  syndrome (GBS), a serious nervous system disorder, has been reported very rarely after recombinant zoster  vaccine. People sometimes faint after medical procedures, including vaccination. Tell your provider if you feel dizzy or have vision changes or ringing in the ears. As with any medicine, there is a very remote chance of a vaccine causing a severe allergic reaction, other serious injury, or death. 5. What if there is a serious problem? An allergic reaction could occur after the vaccinated person leaves the clinic. If you see signs of a severe allergic reaction (hives, swelling of the face and throat, difficulty breathing, a fast heartbeat, dizziness, or weakness), call 9-1-1 and get the person to the nearest hospital. For other signs that concern you, call your health care provider. Adverse reactions should be reported to the Vaccine Adverse Event Reporting System (VAERS). Your health care provider will usually file this report, or you can do it yourself. Visit the VAERS website at www.vaers.SamedayNews.es or call (404)561-1965. VAERS is only for reporting reactions, and VAERS staff members do not give medical advice. 6. How can I learn more? Ask your health care provider. Call your local or state health department. Visit the website of the Food and Drug Administration (FDA) for vaccine package inserts and additional information at http://lopez-wang.org/. Contact the Centers for Disease Control and Prevention (CDC): Call (831)569-9741 (1-800-CDC-INFO) or Visit CDC's website at http://hunter.com/. Source: CDC Vaccine Information Statement Recombinant Zoster Vaccine (11/05/2020) This same material is available at http://www.wolf.info/ for no charge. This information is not intended to replace advice given to you by your health care provider. Make sure you discuss any questions you have with your health care provider. Document Revised: 08/16/2021 Document Reviewed: 11/19/2020 Elsevier Patient Education  [place].

## 2022-09-18 DIAGNOSIS — J302 Other seasonal allergic rhinitis: Secondary | ICD-10-CM | POA: Insufficient documentation

## 2022-09-18 DIAGNOSIS — J309 Allergic rhinitis, unspecified: Secondary | ICD-10-CM | POA: Insufficient documentation

## 2022-09-18 NOTE — Assessment & Plan Note (Addendum)
BP at goal  Stable  Continue current amlodipine '10mg'$  daily  and benazepril '40mg'$  daily  Refills provided

## 2022-09-18 NOTE — Assessment & Plan Note (Signed)
Chronic, stable  Refills provided for cetirizine and flonase

## 2022-10-05 ENCOUNTER — Telehealth: Payer: Self-pay

## 2022-10-05 NOTE — Telephone Encounter (Signed)
Please call to schedule appointment so that we can discuss specifics of the labs she would like to have ordered.   Eulis Foster, MD  [facility]

## 2022-10-05 NOTE — Telephone Encounter (Signed)
LMTCB and makr appt regarding testing

## 2022-10-05 NOTE — Telephone Encounter (Signed)
Copied from [place] [id number]. Topic: Appointment Scheduling - Scheduling Inquiry for Clinic >> Oct 04, 2022  3:07 PM Sabas Sous wrote: Reason for CRM: Pt called requesting lab work, wants to know what her levels and also wants an allergy test.   Best contact: [phone]  Wants a call back to schedule

## 2022-10-05 NOTE — Telephone Encounter (Signed)
Spoke with patient. Patient wants blood work to be done fist to know if she has any type of deficiency and to see if she is allergic to something. Advised patient that she need to be seen and most likely refer to the Allergist doctor. Per patient she would like to have this done fist so that we can have the results when she comes in. Explained to patient that we need to know th reason or symptoms that she is having to associate the labs with correct diagnosis. Per patient to forget about it and don't do anything.

## 2022-11-06 NOTE — Progress Notes (Unsigned)
I,[name],acting as a scribe for [employer], MD.,have documented all relevant documentation on the behalf of Eulis Foster, MD,as directed by  Eulis Foster, MD while in the presence of Eulis Foster, MD.   Established patient visit   Patient: Rhonda Williamson   DOB: Dec 20, 1965   57 y.o. Female  MRN: [id number] Visit Date: 11/07/2022  Today's healthcare provider: Eulis Foster, MD   Chief Complaint  Patient presents with   Referral to Allergist   Subjective    HPI  Request for Allergist Referral  Patient reports that she just wants to know what she is allergic to She states that she has episodes of itching and it feels different than her eczema  She mentions eating crab more than once a month will cause he to have increased itching  She states that she has seasonal allergies and sometimes will notice a change in her breathing  She would like to have extensive allergy testing including materials, foods, seasoning, detergents, etc  She states that sometimes bras with elastic materials will cause her to have hyperpigmented rash She states that she sees dermatology for long hx of eczema  She adds that cat fur will often bother her and cause her to itch and have swollen eyes  Patient reports that she often has trouble with bowel movements after eating beef  HTN  Patient has elevated BP in office  States she recently ate hot dog prior to this appointment and knows that this has a large amount of sodium Reports adherence to amlodpine '10mg'$  and benazepril '40mg'$  daily  She denies HA or dizziness blurry vision today   Medications: Outpatient Medications Prior to Visit  Medication Sig   acetaminophen (TYLENOL) 325 MG tablet Take 2 tablets (650 mg total) by mouth every 6 (six) hours as needed for mild pain or headache (fever >/= 101).   albuterol (VENTOLIN HFA) 108 (90 Base) MCG/ACT inhaler Inhale 2 puffs into the lungs  every 6 (six) hours as needed for wheezing or shortness of breath.   amLODipine (NORVASC) 10 MG tablet TAKE 1 TABLET BY MOUTH DAILY.   benazepril (LOTENSIN) 40 MG tablet Take 1 tablet (40 mg total) by mouth daily.   cetirizine (ZYRTEC) 10 MG tablet Take 1 tablet (10 mg total) by mouth daily.   fluticasone (FLONASE) 50 MCG/ACT nasal spray Spray two sprays into each nostril daily   No facility-administered medications prior to visit.    Review of Systems     Objective    BP (!) 144/71 (BP Location: Left Arm)   Pulse 86   Resp 16   Wt 256 lb 12.8 oz (116.5 kg)   LMP 07/19/1995 (Approximate)   BMI 39.05 kg/m    Physical Exam Vitals reviewed.  Constitutional:      General: She is not in acute distress.    Appearance: Normal appearance. She is not ill-appearing, toxic-appearing or diaphoretic.  Eyes:     Conjunctiva/sclera: Conjunctivae normal.  Cardiovascular:     Rate and Rhythm: Normal rate and regular rhythm.     Pulses: Normal pulses.     Heart sounds: Normal heart sounds. No murmur heard.    No friction rub. No gallop.  Pulmonary:     Effort: Pulmonary effort is normal. No respiratory distress.     Breath sounds: Normal breath sounds. No stridor. No wheezing, rhonchi or rales.  Abdominal:     General: Bowel sounds are normal. There is no distension.  Palpations: Abdomen is soft.     Tenderness: There is no abdominal tenderness.  Musculoskeletal:     Right lower leg: No edema.     Left lower leg: No edema.  Skin:    Findings: Rash present. No erythema. Rash is macular. Rash is not scaling, urticarial or vesicular.     Comments: Hyperpigmented macular lesions on lumbar or thoracic regions of back   Hyperpigmentation in inframammary folds bilaterally   Neurological:     Mental Status: She is alert and oriented to person, place, and time.      No results found for any visits on 11/07/22.  Assessment & Plan     Problem List Items Addressed This Visit        Cardiovascular and Mediastinum   Essential (primary) hypertension    Chronic Blood pressure elevated today, not at goal She will continue amlodipine 10 mg of benazepril 40 mg daily Patient will follow-up in 1 month for blood pressure No medication changes today Will have patient check blood pressure until follow-up visit Recommended that goal be less than 140/90        Respiratory   Seasonal allergic rhinitis     Musculoskeletal and Integument   Pruritus - Primary    Chronic Intermittent Patient would like to have testing for allergy triggers Given primary symptom is pruritus, will check bile acids as well as liver and renal function with CMP Ambulatory referral submitted for allergy testing with allergy and immunology Will also check CBC to look for any signs of anemia that may be associated abnormal sensations in lower extremities      Relevant Orders   Ambulatory referral to Allergy   Comprehensive metabolic panel   Bile acids, total   CBC     Return in about 1 month (around 12/06/2022) for HTN .       The entirety of the information documented in the History of Present Illness, Review of Systems and Physical Exam were personally obtained by me. Portions of this information were initially documented by Lyndel Pleasure, CMA and reviewed by me for thoroughness and accuracy.Eulis Foster, MD     Eulis Foster, MD  [facility] [phone] (phone) [phone] (fax)  [place]

## 2022-11-07 ENCOUNTER — Encounter: Payer: Self-pay | Admitting: Family Medicine

## 2022-11-07 ENCOUNTER — Ambulatory Visit (INDEPENDENT_AMBULATORY_CARE_PROVIDER_SITE_OTHER): Payer: 59 | Admitting: Family Medicine

## 2022-11-07 VITALS — BP 144/71 | HR 86 | Resp 16 | Wt 256.8 lb

## 2022-11-07 DIAGNOSIS — I10 Essential (primary) hypertension: Secondary | ICD-10-CM

## 2022-11-07 DIAGNOSIS — L299 Pruritus, unspecified: Secondary | ICD-10-CM | POA: Diagnosis not present

## 2022-11-07 DIAGNOSIS — J3089 Other allergic rhinitis: Secondary | ICD-10-CM | POA: Diagnosis not present

## 2022-11-07 NOTE — Patient Instructions (Addendum)
I have submitted a referral to allergy for testing. Please be on the lookout for a call to schedule an appointment.   We will collect testing to make sure this itching is not related to liver of bile build up and follow up  Once the results are available    Please schedule a 1 month follow up with me for blood pressure and measure your blood pressure at home daily with goal of less than 140/90.

## 2022-11-07 NOTE — Assessment & Plan Note (Addendum)
Chronic Blood pressure elevated today, not at goal She will continue amlodipine 10 mg of benazepril 40 mg daily Patient will follow-up in 1 month for blood pressure No medication changes today Will have patient check blood pressure until follow-up visit Recommended that goal be less than 140/90

## 2022-11-07 NOTE — Assessment & Plan Note (Signed)
Chronic Intermittent Patient would like to have testing for allergy triggers Given primary symptom is pruritus, will check bile acids as well as liver and renal function with CMP Ambulatory referral submitted for allergy testing with allergy and immunology Will also check CBC to look for any signs of anemia that may be associated abnormal sensations in lower extremities

## 2022-11-08 LAB — CBC
Hematocrit: 39.3 % (ref 34.0–46.6)
Hemoglobin: 13 g/dL (ref 11.1–15.9)
MCH: 28.8 pg (ref 26.6–33.0)
MCHC: 33.1 g/dL (ref 31.5–35.7)
MCV: 87 fL (ref 79–97)
Platelets: 283 10*3/uL (ref 150–450)
RBC: 4.51 x10E6/uL (ref 3.77–5.28)
RDW: 13.4 % (ref 11.7–15.4)
WBC: 8.2 10*3/uL (ref 3.4–10.8)

## 2022-11-08 LAB — COMPREHENSIVE METABOLIC PANEL
ALT: 30 IU/L (ref 0–32)
AST: 24 IU/L (ref 0–40)
Albumin/Globulin Ratio: 1.4 (ref 1.2–2.2)
Albumin: 4.2 g/dL (ref 3.8–4.9)
Alkaline Phosphatase: 76 IU/L (ref 44–121)
BUN/Creatinine Ratio: 17 (ref 9–23)
BUN: 11 mg/dL (ref 6–24)
Bilirubin Total: 0.3 mg/dL (ref 0.0–1.2)
CO2: 26 mmol/L (ref 20–29)
Calcium: 9.7 mg/dL (ref 8.7–10.2)
Chloride: 102 mmol/L (ref 96–106)
Creatinine, Ser: 0.63 mg/dL (ref 0.57–1.00)
Globulin, Total: 3.1 g/dL (ref 1.5–4.5)
Glucose: 96 mg/dL (ref 70–99)
Potassium: 3.8 mmol/L (ref 3.5–5.2)
Sodium: 141 mmol/L (ref 134–144)
Total Protein: 7.3 g/dL (ref 6.0–8.5)
eGFR: 103 mL/min/{1.73_m2} (ref >59)

## 2022-11-08 LAB — BILE ACIDS, TOTAL: Bile Acids Total: 13.5 umol/L — ABNORMAL HIGH (ref 0.0–10.0)

## 2023-01-08 NOTE — Progress Notes (Signed)
     I,[name],acting as a scribe for [employer], MD.,have documented all relevant documentation on the behalf of [name], MD,as directed by  [name], MD while in the presence of [name], MD.   Established patient visit   Patient: Rhonda Williamson   DOB: Sep 05, 1966   57 y.o. Female  MRN: [id number] Visit Date: 01/09/2023  Today's healthcare provider: [name], MD   No chief complaint on file.  Subjective    HPI  HTN: - Medications: Amlodipine 10 mg of benazepril 40 mg daily  - Compliance: *** - Checking BP at home: *** - Denies any SOB, CP, vision changes, LE edema, medication SEs, or symptoms of hypotension - Diet: *** - Exercise: ***   Medications: Outpatient Medications Prior to Visit  Medication Sig   acetaminophen (TYLENOL) 325 MG tablet Take 2 tablets (650 mg total) by mouth every 6 (six) hours as needed for mild pain or headache (fever >/= 101).   albuterol (VENTOLIN HFA) 108 (90 Base) MCG/ACT inhaler Inhale 2 puffs into the lungs every 6 (six) hours as needed for wheezing or shortness of breath.   amLODipine (NORVASC) 10 MG tablet TAKE 1 TABLET BY MOUTH DAILY.   benazepril (LOTENSIN) 40 MG tablet Take 1 tablet (40 mg total) by mouth daily.   cetirizine (ZYRTEC) 10 MG tablet Take 1 tablet (10 mg total) by mouth daily.   fluticasone (FLONASE) 50 MCG/ACT nasal spray Spray two sprays into each nostril daily   No facility-administered medications prior to visit.    Review of Systems  {Labs  Heme  Chem  Endocrine  Serology  Results Review (optional):23779}   Objective    LMP 07/19/1995 (Approximate)  {Show previous vital signs (optional):23777}  Physical Exam  ***  No results found for any visits on 01/09/23.  Assessment & Plan     ***  No follow-ups on file.      {provider attestation***:1}   [name], MD  [facility] [phone] (phone) [phone] (fax)  [facility]

## 2023-01-09 ENCOUNTER — Ambulatory Visit (INDEPENDENT_AMBULATORY_CARE_PROVIDER_SITE_OTHER): Payer: 59 | Admitting: Family Medicine

## 2023-01-09 ENCOUNTER — Encounter: Payer: Self-pay | Admitting: Family Medicine

## 2023-01-09 VITALS — BP 143/80 | HR 81 | Temp 98.7°F | Resp 16 | Wt 261.5 lb

## 2023-01-09 DIAGNOSIS — I10 Essential (primary) hypertension: Secondary | ICD-10-CM

## 2023-01-09 DIAGNOSIS — N3001 Acute cystitis with hematuria: Secondary | ICD-10-CM | POA: Diagnosis not present

## 2023-01-09 DIAGNOSIS — R399 Unspecified symptoms and signs involving the genitourinary system: Secondary | ICD-10-CM

## 2023-01-09 DIAGNOSIS — R8290 Unspecified abnormal findings in urine: Secondary | ICD-10-CM

## 2023-01-09 DIAGNOSIS — N951 Menopausal and female climacteric states: Secondary | ICD-10-CM | POA: Diagnosis not present

## 2023-01-09 LAB — POCT URINALYSIS DIPSTICK
Bilirubin, UA: NEGATIVE
Glucose, UA: NEGATIVE
Ketones, UA: NEGATIVE
Nitrite, UA: POSITIVE
Protein, UA: NEGATIVE
Spec Grav, UA: 1.015 (ref 1.010–1.025)
Urobilinogen, UA: 0.2 E.U./dL
pH, UA: 5 (ref 5.0–8.0)

## 2023-01-09 MED ORDER — AMLODIPINE BESY-BENAZEPRIL HCL 10-40 MG PO CAPS: 1.0000 | ORAL_CAPSULE | Freq: Every day | ORAL | 2 refills | Status: DC

## 2023-01-09 MED ORDER — HYDROCHLOROTHIAZIDE 12.5 MG PO CAPS: 12.5000 mg | ORAL_CAPSULE | Freq: Every day | ORAL | 1 refills | Status: DC

## 2023-01-09 MED ORDER — CEPHALEXIN 500 MG PO CAPS
500.0000 mg | ORAL_CAPSULE | Freq: Three times a day (TID) | ORAL | 0 refills | Status: AC
Start: 2023-01-09 — End: 2023-01-19

## 2023-01-09 MED ORDER — PHENAZOPYRIDINE HCL 100 MG PO TABS: 100.0000 mg | ORAL_TABLET | Freq: Three times a day (TID) | ORAL | 0 refills | Status: DC | PRN

## 2023-01-09 NOTE — Assessment & Plan Note (Signed)
Chronic  Elevated, not at goal  Will add hctz 12.5mg  and combine amlodipine10mg  with benazepril 40mg  daily  Patient will follow up for HTN in 2 weeks  Patient encouraged to monitor BP 1-2 hours after BP medications and record to review at next visit

## 2023-01-09 NOTE — Patient Instructions (Addendum)
I have combined your amlodipine and benazepril for your blood pressure. You will also start a new pill, hydrochlorothiazide 12.5mg  daily. Please keep track of your blood pressure and follow up in 2 weeks   DATE Systolic Diastolic Heart Rate  Example:  143 80 81                                                                                                         Your urine studies were positive for a UTI so I will order a culture and antibiotic, called Keflex. I have also sent a medication to use as needed for irritation with urination called pyridium.     Menopause: Evening Primrose or see the medication details below   Fezolinetant Tablets What is this medication? FEZOLINETANT (FEZ oh LIN e tant) reduces the number and severity of hot flashes due to menopause. It works by blocking substances in your body that cause hot flashes and night sweats. This medicine may be used for other purposes; ask your health care provider or pharmacist if you have questions. COMMON BRAND NAME(S): VEOZAH What should I tell my care team before I take this medication? They need to know if you have any of these conditions: Kidney disease Liver disease An unusual or allergic reaction to fezolinetant, other medications, foods, dyes, or preservatives Pregnant or trying to get pregnant Breastfeeding How should I use this medication? Take this medication by mouth with water. Take it as directed on the prescription label at the same time every day. Do not cut, crush, or chew this medication. Swallow the tablets whole. You can take it with or without food. If it upsets your stomach, take it with food. Keep taking it unless your care team tells you to stop. Talk to your care team about the use of this medication in children. Special care may be needed. Overdosage: If you think you have taken too much of this medicine contact a poison control center or emergency room at once. NOTE: This medicine  is only for you. Do not share this medicine with others. What if I miss a dose? If you miss a dose, take it as soon as you can unless it is more than 12 hours late. If it is more than 12 hours late, skip the missed dose. Take the next dose at the normal time. What may interact with this medication? Other medications may affect the way this medication works. Talk with your care team about all of the medications you take. They may suggest changes to your treatment plan to lower the risk of side effects and to make sure your medications work as intended. This list may not describe all possible interactions. Give your health care provider a list of all the medicines, herbs, non-prescription drugs, or dietary supplements you use. Also tell them if you smoke, drink alcohol, or use illegal drugs. Some items may interact with your medicine. What should I watch for while using this medication? Visit your care team for regular checks on your progress. Tell your care team if your symptoms do  not start to get better or if they get worse. You may need blood work while taking this medication. What side effects may I notice from receiving this medication? Side effects that you should report to your care team as soon as possible: Allergic reactions--skin rash, itching, hives, swelling of the face, lips, tongue, or throat Liver injury--right upper belly pain, loss of appetite, nausea, light-colored stool, dark yellow or brown urine, yellowing skin or eyes, unusual weakness or fatigue Side effects that usually do not require medical attention (report these to your care team if they continue or are bothersome): Back pain Diarrhea Hot flashes Stomach pain Trouble sleeping This list may not describe all possible side effects. Call your doctor for medical advice about side effects. You may report side effects to FDA at 1-800-FDA-1088. Where should I keep my medication? Keep out of the reach of children and  pets. Store at room temperature between 20 and 25 degrees C (68 and 77 degrees F). Get rid of any unused medication after the expiration date. To get rid of medications that are no longer needed or have expired: Take the medication to a medication take-back program. Check with your pharmacy or law enforcement to find a location. If you cannot return the medication, check the label or package insert to see if the medication should be thrown out in the garbage or flushed down the toilet. If you are not sure, ask your care team. If it is safe to put it in the trash, take the medication out of the container. Mix the medication with cat litter, dirt, coffee grounds, or other unwanted substance. Seal the mixture in a bag or container. Put it in the trash. NOTE: This sheet is a summary. It may not cover all possible information. If you have questions about this medicine, talk to your doctor, pharmacist, or health care provider.  2023 Elsevier/Gold Standard (2022-02-23 00:00:00)

## 2023-01-10 DIAGNOSIS — Z6841 Body Mass Index (BMI) 40.0 and over, adult: Secondary | ICD-10-CM | POA: Insufficient documentation

## 2023-01-10 DIAGNOSIS — N951 Menopausal and female climacteric states: Secondary | ICD-10-CM | POA: Insufficient documentation

## 2023-01-10 DIAGNOSIS — J3081 Allergic rhinitis due to animal (cat) (dog) hair and dander: Secondary | ICD-10-CM | POA: Insufficient documentation

## 2023-01-10 DIAGNOSIS — N3001 Acute cystitis with hematuria: Secondary | ICD-10-CM | POA: Insufficient documentation

## 2023-01-10 NOTE — Assessment & Plan Note (Signed)
Chronic symptoms, vasomotor  Recommended OTC evening primrose  Patient given information of feozah to review

## 2023-01-10 NOTE — Assessment & Plan Note (Signed)
Acute symptoms  U/A with nitrites and blood  Microscopy ordered  Urine culture ordered  Will treat with Keflex500mg  TID  Treat w/ pyridium 100mg  TID  F/u if symptoms not improved

## 2023-01-12 LAB — URINALYSIS, MICROSCOPIC ONLY

## 2023-01-12 LAB — SPECIMEN STATUS REPORT

## 2023-01-13 LAB — URINALYSIS, MICROSCOPIC ONLY: WBC, UA: >30 /hpf — AB (ref 0–5)

## 2023-01-14 LAB — URINALYSIS, MICROSCOPIC ONLY: Casts: NONE SEEN /lpf

## 2023-01-14 LAB — URINE CULTURE

## 2023-01-24 NOTE — Progress Notes (Unsigned)
I,Rhonda Williamson,acting as a scribe for [employer], Williamson.,have documented all relevant documentation on the behalf of Rhonda Williamson,as directed by  Rhonda Williamson while in the presence of Rhonda Williamson.   Established patient visit   Patient: Rhonda Williamson   DOB: July 02, 1966   57 y.o. Female  MRN: [id number] Visit Date: 01/25/2023  Today's healthcare provider: [name], Williamson   Chief Complaint  Patient presents with   Follow-Up BP   Subjective    HPI  Hypertension: Patient here for follow-up of elevated blood pressure. Management from last office visit include added hctz 12.5mg  and combine amlodipine10mg  with benazepril  daily.  Blood pressures at home are 130/87.  Cardiac symptoms none.  Patient denies chest pain, chest pressure/discomfort, fatigue, irregular heart beat, lower extremity edema, near-syncope, and palpitations.   The 10-year ASCVD risk score (Arnett DK, et al., 2019) is: 4.3%   BP Readings from Last 3 Encounters:  01/25/23 123/82  01/09/23 (!) 143/80  11/07/22 (!) 144/71   Wt Readings from Last 3 Encounters:  01/25/23 261 lb 3.2 oz (118.5 kg)  01/09/23 261 lb 8 oz (118.6 kg)  11/07/22 256 lb 12.8 oz (116.5 kg)     Medications: Outpatient Medications Prior to Visit  Medication Sig   acetaminophen (TYLENOL) 325 MG tablet Take 2 tablets (650 mg total) by mouth every 6 (six) hours as needed for mild pain or headache (fever >/= 101).   albuterol (VENTOLIN HFA) 108 (90 Base) MCG/ACT inhaler Inhale 2 puffs into the lungs every 6 (six) hours as needed for wheezing or shortness of breath.   amLODipine-benazepril (LOTREL) 10-40 MG capsule Take 1 capsule by mouth daily.   cetirizine (ZYRTEC) 10 MG tablet Take 1 tablet (10 mg total) by mouth daily.   fluticasone (FLONASE) 50 MCG/ACT nasal spray Spray two sprays into each nostril daily   hydrochlorothiazide (MICROZIDE) 12.5 MG capsule Take 1  capsule (12.5 mg total) by mouth daily.   phenazopyridine (PYRIDIUM) 100 MG tablet Take 1 tablet (100 mg total) by mouth 3 (three) times daily as needed for pain.   No facility-administered medications prior to visit.    Review of Systems     Objective    BP 123/82 (BP Location: Left Arm, Patient Position: Sitting, Cuff Size: Large)   Pulse 93   Temp 98.8 F (37.1 C) (Oral)   Resp 16   Wt 261 lb 3.2 oz (118.5 kg)   LMP 07/19/1995 (Approximate)   BMI 39.72 kg/m    Physical Exam Vitals reviewed.  Constitutional:      General: She is not in acute distress.    Appearance: Normal appearance. She is not ill-appearing, toxic-appearing or diaphoretic.  Eyes:     Conjunctiva/sclera: Conjunctivae normal.  Cardiovascular:     Rate and Rhythm: Normal rate and regular rhythm.     Pulses: Normal pulses.     Heart sounds: Normal heart sounds. No murmur heard.    No friction rub. No gallop.  Pulmonary:     Effort: Pulmonary effort is normal. No respiratory distress.     Breath sounds: Normal breath sounds. No stridor. No wheezing, rhonchi or rales.  Abdominal:     General: Bowel sounds are normal. There is no distension.     Palpations: Abdomen is soft.     Tenderness: There is no abdominal tenderness.       Comments: Multiple skin tags on abdomen and in bilateral axillary regions, more in left than  right axilla  Musculoskeletal:     Right lower leg: Edema present.     Left lower leg: Edema present.  Skin:    Findings: No erythema or rash.     Comments: Multiple bilateral axillary skin tags   Neurological:     Mental Status: She is alert and oriented to person, place, and time.       No results found for any visits on 01/25/23.  Assessment & Plan     Problem List Items Addressed This Visit       Cardiovascular and Mediastinum   Essential (primary) hypertension - Primary    Controlled BP at goal Continue current medications at current doses No medications changes  today  BMP collected today to assess creatinine and potassium after starting ACEi and diuretic       Relevant Orders   Basic Metabolic Panel (BMET)     Musculoskeletal and Integument   Skin tags, multiple acquired    Chronic  Recurrent  Patient declines derm referral to have skin tags biopsied  Patient prefers to have them removed with cryotherapy to avoid needles         Genitourinary   Vaginal candidiasis    Vaginal pruritus and discharge following abx treatment for UTI  Diflucan  (two tabs) prescribed to take 72 hours apart  F/u PRN       Relevant Medications   fluconazole (DIFLUCAN) 150 MG tablet     Return for skin tag removal .       The entirety of the information documented in the History of Present Illness, Review of Systems and Physical Exam were personally obtained by me. Portions of this information were initially documented by Rhonda Williamson, CMA . I, Rhonda Williamson have reviewed the documentation above for thoroughness and accuracy.    Rhonda Williamson  [facility] [phone] (phone) [phone] (fax)  [facility]

## 2023-01-25 ENCOUNTER — Ambulatory Visit (INDEPENDENT_AMBULATORY_CARE_PROVIDER_SITE_OTHER): Payer: 59 | Admitting: Family Medicine

## 2023-01-25 ENCOUNTER — Encounter: Payer: Self-pay | Admitting: Family Medicine

## 2023-01-25 VITALS — BP 123/82 | HR 93 | Temp 98.8°F | Resp 16 | Wt 261.2 lb

## 2023-01-25 DIAGNOSIS — L918 Other hypertrophic disorders of the skin: Secondary | ICD-10-CM

## 2023-01-25 DIAGNOSIS — I10 Essential (primary) hypertension: Secondary | ICD-10-CM

## 2023-01-25 DIAGNOSIS — B3731 Acute candidiasis of vulva and vagina: Secondary | ICD-10-CM

## 2023-01-25 MED ORDER — FLUCONAZOLE 150 MG PO TABS: ORAL_TABLET | ORAL | 0 refills | Status: DC

## 2023-01-25 NOTE — Patient Instructions (Addendum)
We will follow up with results of labs once they are available.    I have sent in a prescription for you yeast symptoms. Please take one tablet today and then repeat after 72 hours.    Please schedule a procedure visit for you skin tag removal

## 2023-01-25 NOTE — Assessment & Plan Note (Signed)
Chronic  Recurrent  Patient declines derm referral to have skin tags biopsied  Patient prefers to have them removed with cryotherapy to avoid needles

## 2023-01-25 NOTE — Assessment & Plan Note (Signed)
Vaginal pruritus and discharge following abx treatment for UTI  Diflucan  (two tabs) prescribed to take 72 hours apart  F/u PRN

## 2023-01-25 NOTE — Assessment & Plan Note (Signed)
Controlled BP at goal Continue current medications at current doses No medications changes today  BMP collected today to assess creatinine and potassium after starting ACEi and diuretic

## 2023-01-29 ENCOUNTER — Other Ambulatory Visit: Payer: Self-pay | Admitting: Family Medicine

## 2023-01-29 MED ORDER — FLUCONAZOLE 150 MG PO TABS: ORAL_TABLET | ORAL | 0 refills | Status: DC

## 2023-02-12 NOTE — Progress Notes (Unsigned)
      Established patient visit   Patient: Rhonda Williamson   DOB: 06-19-1966   57 y.o. Female  MRN: [id number] Visit Date: 02/13/2023  Today's healthcare provider: [name], MD   No chief complaint on file.  Subjective    HPI  Patient comes in for skin tags removal.  Medications: Outpatient Medications Prior to Visit  Medication Sig   acetaminophen (TYLENOL) 325 MG tablet Take 2 tablets (650 mg total) by mouth every 6 (six) hours as needed for mild pain or headache (fever >/= 101).   albuterol (VENTOLIN HFA) 108 (90 Base) MCG/ACT inhaler Inhale 2 puffs into the lungs every 6 (six) hours as needed for wheezing or shortness of breath.   amLODipine-benazepril (LOTREL) 10-40 MG capsule Take 1 capsule by mouth daily.   cetirizine (ZYRTEC) 10 MG tablet Take 1 tablet (10 mg total) by mouth daily.   fluconazole (DIFLUCAN) 150 MG tablet Take one 150 mg tablet by mouth   fluticasone (FLONASE) 50 MCG/ACT nasal spray Spray two sprays into each nostril daily   hydrochlorothiazide (MICROZIDE) 12.5 MG capsule Take 1 capsule (12.5 mg total) by mouth daily.   phenazopyridine (PYRIDIUM) 100 MG tablet Take 1 tablet (100 mg total) by mouth 3 (three) times daily as needed for pain.   No facility-administered medications prior to visit.    Review of Systems  {Labs  Heme  Chem  Endocrine  Serology  Results Review (optional):23779}   Objective    LMP 07/19/1995 (Approximate)  {Show previous vital signs (optional):23777}  Physical Exam  ***  No results found for any visits on 02/13/23.  Assessment & Plan     ***  No follow-ups on file.      {provider attestation***:1}   [name], MD  [facility] [phone] (phone) [phone] (fax)  [facility]

## 2023-02-13 ENCOUNTER — Ambulatory Visit (INDEPENDENT_AMBULATORY_CARE_PROVIDER_SITE_OTHER): Payer: 59 | Admitting: Family Medicine

## 2023-02-13 ENCOUNTER — Encounter: Payer: Self-pay | Admitting: Family Medicine

## 2023-02-13 VITALS — BP 147/85 | HR 78 | Temp 98.8°F | Resp 15 | Ht 68.0 in | Wt 264.3 lb

## 2023-02-13 DIAGNOSIS — L918 Other hypertrophic disorders of the skin: Secondary | ICD-10-CM | POA: Diagnosis not present

## 2023-02-13 DIAGNOSIS — N898 Other specified noninflammatory disorders of vagina: Secondary | ICD-10-CM | POA: Diagnosis not present

## 2023-02-13 MED ORDER — METRONIDAZOLE 500 MG PO TABS: 500.0000 mg | ORAL_TABLET | Freq: Three times a day (TID) | ORAL | 0 refills | Status: AC

## 2023-02-13 MED ORDER — FLUCONAZOLE 150 MG PO TABS
ORAL_TABLET | ORAL | 0 refills | Status: DC
Start: 2023-02-13 — End: 2023-03-07

## 2023-02-13 NOTE — Assessment & Plan Note (Signed)
PROCEDURE: Cryotherapy for skin tags    The area surrounding the skin lesion was prepared in the usual sterile manner.  The cryotherapy pen was then applied for 3 seconds until an ice ball formed with a 1-2 mm border around each acrochordon.  This was allowed to thaw and then the cryotherapy was again applied for 3 seconds to an ice ball of 5-7 mm.     The patient tolerated the procedure well.  Return precautions provided.

## 2023-02-13 NOTE — Assessment & Plan Note (Signed)
Recurrent symptoms suspicious for BV  Will treat with metronidazole and prescribe empiric diflucan  Patient referred to GYN for evaluation

## 2023-02-14 LAB — BASIC METABOLIC PANEL
BUN/Creatinine Ratio: 18 (ref 9–23)
BUN: 12 mg/dL (ref 6–24)
CO2: 27 mmol/L (ref 20–29)
Calcium: 9.3 mg/dL (ref 8.7–10.2)
Chloride: 104 mmol/L (ref 96–106)
Creatinine, Ser: 0.65 mg/dL (ref 0.57–1.00)
Glucose: 101 mg/dL — ABNORMAL HIGH (ref 70–99)
Potassium: 4.4 mmol/L (ref 3.5–5.2)
Sodium: 143 mmol/L (ref 134–144)
eGFR: 102 mL/min/{1.73_m2} (ref >59)

## 2023-03-01 ENCOUNTER — Telehealth: Payer: Self-pay | Admitting: Family Medicine

## 2023-03-01 ENCOUNTER — Ambulatory Visit: Payer: Self-pay | Admitting: *Deleted

## 2023-03-01 ENCOUNTER — Encounter: Payer: Self-pay | Admitting: Family Medicine

## 2023-03-01 ENCOUNTER — Ambulatory Visit (INDEPENDENT_AMBULATORY_CARE_PROVIDER_SITE_OTHER): Payer: 59 | Admitting: Family Medicine

## 2023-03-01 VITALS — BP 144/77 | HR 94 | Temp 98.6°F | Ht 68.0 in | Wt 267.0 lb

## 2023-03-01 DIAGNOSIS — R739 Hyperglycemia, unspecified: Secondary | ICD-10-CM | POA: Insufficient documentation

## 2023-03-01 DIAGNOSIS — R2242 Localized swelling, mass and lump, left lower limb: Secondary | ICD-10-CM

## 2023-03-01 DIAGNOSIS — R252 Cramp and spasm: Secondary | ICD-10-CM | POA: Insufficient documentation

## 2023-03-01 DIAGNOSIS — I10 Essential (primary) hypertension: Secondary | ICD-10-CM

## 2023-03-01 DIAGNOSIS — G4733 Obstructive sleep apnea (adult) (pediatric): Secondary | ICD-10-CM

## 2023-03-01 DIAGNOSIS — Z1159 Encounter for screening for other viral diseases: Secondary | ICD-10-CM | POA: Diagnosis not present

## 2023-03-01 DIAGNOSIS — R601 Generalized edema: Secondary | ICD-10-CM | POA: Insufficient documentation

## 2023-03-01 NOTE — Assessment & Plan Note (Signed)
Dependent edema in BLE; LLE >RLE.

## 2023-03-01 NOTE — Assessment & Plan Note (Signed)
Low risk screen Treatable, and curable. If left untreated Hep C can lead to cirrhosis and liver failure. Encourage routine testing; recommend repeat testing if risk factors change.  

## 2023-03-01 NOTE — Assessment & Plan Note (Signed)
Hx of pre-diabetes Continue to recommend balanced, lower carb meals. Smaller meal size, adding snacks. Choosing water as drink of choice and increasing purposeful exercise. Repeat A1c

## 2023-03-01 NOTE — Assessment & Plan Note (Signed)
Chronic, well controlled Use of CPAP nightly Unclear if she has had a recent compliance check or is getting good reduction in AHI

## 2023-03-01 NOTE — Telephone Encounter (Signed)
It looks like we have plenty of openings today.  We can diagnose and treat DVTs, so we could just offer her an appt.

## 2023-03-01 NOTE — Assessment & Plan Note (Signed)
Chronic, stable Increased weight noted Body mass index is 40.6 kg/m. Discussed importance of healthy weight management Discussed diet and exercise

## 2023-03-01 NOTE — Telephone Encounter (Signed)
  Chief Complaint: Left calf is swollen, painful and warm Symptoms: Having swelling of left calf for last 2 days with increased pain.   Left calf feels warmer than the other one   Pain 8/10 when it's not elevated. Frequency: For the last 2 days. Pertinent Negatives: Patient denies history of blood clots. Disposition: [x] ED /[] Urgent Care (no appt availability in office) / [] Appointment(In office/virtual)/ []  [place] [facility]/ [] Home Care/ [] Refused Recommended Disposition /[]  Mobile Bus/ []  Follow-up with PCP Additional Notes: Referred her to the ED.   Pt agreeable to going but said due to having a client she could not go until this afternoon.  Going to [facility].  I went over the s/s to call 911 immediately in the care advice.   She verbalized understanding.   I encouraged her not to rub or massage her left leg.    If it is a clot don't want to break it loose.

## 2023-03-01 NOTE — Telephone Encounter (Signed)
Message from [place] sent at 03/01/2023  8:39 AM EDT  Summary: left leg calf swollen   Pt stated that two days ago she had a cramp on her left leg calf, and since then her calf has been swollen and has some pain.  Pt seeking clinical advice and requested an appointment.          Call History   Type Contact Phone/Fax User  03/01/2023 08:36 AM EDT Phone (Incoming) Rhonda Williamson, Rhonda Williamson (Self) [phone] Rexene Edison) McGill, Alondra   Reason for Disposition  [1] Thigh or calf pain AND [2] only 1 side AND [3] present > 1 hour  Answer Assessment - Initial Assessment Questions 1. ONSET: "When did the swelling start?" (e.g., minutes, hours, days)     I'm having swelling in my left calf.   The last 2 days it's very swollen.   It feels warm and painful.   At first I thought I had a cramp but the cramp isn't going away so that's why I'm concerned. 2. LOCATION: "What part of the leg is swollen?"  "Are both legs swollen or just one leg?"    Just my calf on the left side.   I'm having some pain in my thigh but it's not swollen.     3. SEVERITY: "How bad is the swelling?" (e.g., localized; mild, moderate, severe)   - Localized: Small area of swelling localized to one leg.   - MILD pedal edema: Swelling limited to foot and ankle, pitting edema < 1/4 inch (6 mm) deep, rest and elevation eliminate most or all swelling.   - MODERATE edema: Swelling of lower leg to knee, pitting edema > 1/4 inch (6 mm) deep, rest and elevation only partially reduce swelling.   - SEVERE edema: Swelling extends above knee, facial or hand swelling present.      My left calf is swollen and warmer than the other calf. 4. REDNESS: "Does the swelling look red or infected?"     No 5. PAIN: "Is the swelling painful to touch?" If Yes, ask: "How painful is it?"   (Scale 1-10; mild, moderate or severe)     5/10 when it's elevated.    8/10 when not elevated. 6. FEVER: "Do you have a fever?" If Yes, ask: "What is it, how was it measured,  and when did it start?"      Not asked 7. CAUSE: "What do you think is causing the leg swelling?"     Maybe a blood clot.      8. MEDICAL HISTORY: "Do you have a history of blood clots (e.g., DVT), cancer, heart failure, kidney disease, or liver failure?"     No 9. RECURRENT SYMPTOM: "Have you had leg swelling before?" If Yes, ask: "When was the last time?" "What happened that time?"     No 10. OTHER SYMPTOMS: "Do you have any other symptoms?" (e.g., chest pain, difficulty breathing)       Denies shortness of breath or chest pain. 11. PREGNANCY: "Is there any chance you are pregnant?" "When was your last menstrual period?"       Not asked due to age  Protocols used: Leg Swelling and Edema-A-AH

## 2023-03-01 NOTE — Progress Notes (Signed)
Established patient visit  Patient: Rhonda Williamson   DOB: Jan 09, 1966   57 y.o. Female  MRN: [id number] Visit Date: 03/01/2023  Today's healthcare provider: Jacky Kindle, FNP  Introduced to nurse practitioner role and practice setting.  All questions answered.  Discussed provider/patient relationship and expectations.  Chief Complaint  Patient presents with   Leg Pain    Pt stated--left back of the leg--cramping, pain, swollen--4 days. Tried compression stocking.   Subjective    Leg Pain   Edema: Patient complains of edema. The location of the edema is generalized.  The edema has been mild.  Onset of symptoms was 5 days ago, gradually improving since that time. The edema is present all day. The patient states she has been using TED hose.  The swelling has been aggravated by hot weather, increased salt intake, and use of calcium channel blockers, relieved by support stockings, and been associated with weight gain. Cardiac risk factors include hypertension, obesity (BMI >= 30 kg/m2), and sedentary lifestyle.  HPI     Leg Pain    Additional comments: Pt stated--left back of the leg--cramping, pain, swollen--4 days. Tried compression stocking.      Last edited by Shelly Bombard, CMA on 03/01/2023  2:28 PM.      Medications: Outpatient Medications Prior to Visit  Medication Sig   acetaminophen (TYLENOL) 325 MG tablet Take 2 tablets (650 mg total) by mouth every 6 (six) hours as needed for mild pain or headache (fever >/= 101).   albuterol (VENTOLIN HFA) 108 (90 Base) MCG/ACT inhaler Inhale 2 puffs into the lungs every 6 (six) hours as needed for wheezing or shortness of breath.   amLODipine-benazepril (LOTREL) 10-40 MG capsule Take 1 capsule by mouth daily.   cetirizine (ZYRTEC) 10 MG tablet Take 1 tablet (10 mg total) by mouth daily.   fluconazole (DIFLUCAN) 150 MG tablet Take one 150mg  tablet with completion of antibiotics and repeat with one tablet by mouth after 72 hours    fluticasone (FLONASE) 50 MCG/ACT nasal spray Spray two sprays into each nostril daily   hydrochlorothiazide (MICROZIDE) 12.5 MG capsule Take 1 capsule (12.5 mg total) by mouth daily.   phenazopyridine (PYRIDIUM) 100 MG tablet Take 1 tablet (100 mg total) by mouth 3 (three) times daily as needed for pain.   No facility-administered medications prior to visit.    Review of Systems  Last CBC Lab Results  Component Value Date   WBC 8.2 11/07/2022   HGB 13.0 11/07/2022   HCT 39.3 11/07/2022   MCV 87 11/07/2022   MCH 28.8 11/07/2022   RDW 13.4 11/07/2022   PLT 283 11/07/2022   Last metabolic panel Lab Results  Component Value Date   GLUCOSE 101 (H) 02/13/2023   NA 143 02/13/2023   K 4.4 02/13/2023   CL 104 02/13/2023   CO2 27 02/13/2023   BUN 12 02/13/2023   CREATININE 0.65 02/13/2023   EGFR 102 02/13/2023   CALCIUM 9.3 02/13/2023   PROT 7.3 11/07/2022   ALBUMIN 4.2 11/07/2022   LABGLOB 3.1 11/07/2022   AGRATIO 1.4 11/07/2022   BILITOT 0.3 11/07/2022   ALKPHOS 76 11/07/2022   AST 24 11/07/2022   ALT 30 11/07/2022   ANIONGAP 10 10/18/2019   Last lipids Lab Results  Component Value Date   CHOL 130 03/01/2021   HDL 43 03/01/2021   LDLCALC 69 03/01/2021   TRIG 93 03/01/2021   CHOLHDL 3.0 03/01/2021   Last hemoglobin A1c Lab Results  Component Value Date   HGBA1C 5.9 (A) 08/29/2019       Objective    BP (!) 144/77 (BP Location: Right Arm, Patient Position: Sitting, Cuff Size: Large)   Pulse 94   Temp 98.6 F (37 C)   Ht 5\' 8"  (1.727 m)   Wt 267 lb (121.1 kg)   LMP 07/19/1995 (Approximate)   SpO2 96%   BMI 40.60 kg/m  3 BP Readings from Last 3 Encounters:  03/01/23 (!) 144/77  02/13/23 (!) 147/85  01/25/23 123/82   Wt Readings from Last 3 Encounters:  03/01/23 267 lb (121.1 kg)  02/13/23 264 lb 4.8 oz (119.9 kg)  01/25/23 261 lb 3.2 oz (118.5 kg)   SpO2 Readings from Last 3 Encounters:  03/01/23 96%  02/13/23 98%  02/01/22 96%   Physical  Exam Vitals and nursing note reviewed.  Constitutional:      General: She is not in acute distress.    Appearance: Normal appearance. She is obese. She is not ill-appearing, toxic-appearing or diaphoretic.  HENT:     Head: Normocephalic and atraumatic.  Cardiovascular:     Rate and Rhythm: Normal rate and regular rhythm.     Pulses: Normal pulses.     Heart sounds: Normal heart sounds. No murmur heard.    No friction rub. No gallop.  Pulmonary:     Effort: Pulmonary effort is normal. No respiratory distress.     Breath sounds: Normal breath sounds. No stridor. No wheezing, rhonchi or rales.  Chest:     Chest wall: No tenderness.  Abdominal:     General: Bowel sounds are normal.     Palpations: Abdomen is soft.  Musculoskeletal:        General: No swelling, tenderness, deformity or signs of injury. Normal range of motion.     Right lower leg: Edema present.     Left lower leg: Edema present.       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     Capillary Refill: Capillary refill takes less than 2 seconds.     Coloration: Skin is not jaundiced or pale.     Findings: No bruising, erythema, lesion or rash.  Neurological:     General: No focal deficit present.     Mental Status: She is alert and oriented to person, place, and time. Mental status is at baseline.     Cranial Nerves: No cranial nerve deficit.     Sensory: No sensory deficit.     Motor: No weakness.     Coordination: Coordination normal.  Psychiatric:        Mood and Affect: Mood normal.        Behavior: Behavior normal.        Thought Content: Thought content normal.        Judgment: Judgment normal.     No results found for any visits on 03/01/23.  Assessment & Plan     Problem List Items Addressed This Visit       Cardiovascular and Mediastinum   Essential (primary) hypertension    Chronic,  elevated BP x 2 visits Slight weight gain noted BP goal of <139/<89 without diabetes The 10-year ASCVD risk score (Arnett DK, et al., 2019) is: 7% However, a stricter controller would be recommended with elevated ASCVD risk  Endorses compliance of lotrel 10-40  mg and hctz 12.5 mg          Respiratory   Obstructive apnea    Chronic, well controlled Use of CPAP nightly Unclear if she has had a recent compliance check or is getting good reduction in AHI        Other   Elevated serum glucose    Hx of pre-diabetes Continue to recommend balanced, lower carb meals. Smaller meal size, adding snacks. Choosing water as drink of choice and increasing purposeful exercise. Repeat A1c      Relevant Orders   Hemoglobin A1c   Encounter for hepatitis C screening test for low risk patient    Low risk screen Treatable, and curable. If left untreated Hep C can lead to cirrhosis and liver failure. Encourage routine testing; recommend repeat testing if risk factors change.       Relevant Orders   Hepatitis C Antibody   Foot mass, left    Acute, worsening Refer to podiatry to assist       Relevant Orders   Ambulatory referral to Podiatry   Generalized edema - Primary    Dependent edema in BLE; LLE >RLE.       Relevant Orders   Uric acid   Basic Metabolic Panel (BMET)   Magnesium   Leg cramping    Acute self limited R/o gout, electrolyte issue s/p hctz start No changes in bp regimen other than use of daily combo pill vs two separate meds  Repeat bmp, mg and uric acid      Relevant Orders   Uric acid   Basic Metabolic Panel (BMET)   Magnesium   Morbid obesity (HCC)    Chronic, stable Increased weight noted Body mass index is 40.6 kg/m. Discussed importance of healthy weight management Discussed diet and exercise       Return if symptoms worsen or fail to improve.     Leilani Merl, FNP, have reviewed all documentation for this visit. The documentation on 03/01/23  for the exam, diagnosis, procedures, and orders are all accurate and complete.  Jacky Kindle, FNP  [facility] [phone] (phone) [phone] (fax)  [facility]

## 2023-03-01 NOTE — Assessment & Plan Note (Signed)
Acute self limited R/o gout, electrolyte issue s/p hctz start No changes in bp regimen other than use of daily combo pill vs two separate meds  Repeat bmp, mg and uric acid

## 2023-03-01 NOTE — Assessment & Plan Note (Signed)
Acute, worsening Refer to podiatry to assist

## 2023-03-01 NOTE — Assessment & Plan Note (Signed)
Chronic, elevated BP x 2 visits Slight weight gain noted BP goal of <139/<89 without diabetes The 10-year ASCVD risk score (Arnett DK, et al., 2019) is: 7% However, a stricter controller would be recommended with elevated ASCVD risk  Endorses compliance of lotrel 10-40 mg and hctz 12.5 mg

## 2023-03-01 NOTE — Telephone Encounter (Signed)
Pt. Calling to ask why a Hepatitis C test was done today. Instructed it is a screening test her provider ordered. Verbalizes understanding.

## 2023-03-02 ENCOUNTER — Other Ambulatory Visit: Payer: Self-pay | Admitting: Family Medicine

## 2023-03-02 ENCOUNTER — Encounter: Payer: Self-pay | Admitting: Family Medicine

## 2023-03-02 DIAGNOSIS — R252 Cramp and spasm: Secondary | ICD-10-CM

## 2023-03-02 DIAGNOSIS — R7303 Prediabetes: Secondary | ICD-10-CM

## 2023-03-02 LAB — BASIC METABOLIC PANEL
BUN/Creatinine Ratio: 19 (ref 9–23)
BUN: 12 mg/dL (ref 6–24)
CO2: 24 mmol/L (ref 20–29)
Calcium: 9.5 mg/dL (ref 8.7–10.2)
Chloride: 104 mmol/L (ref 96–106)
Creatinine, Ser: 0.63 mg/dL (ref 0.57–1.00)
Glucose: 156 mg/dL — ABNORMAL HIGH (ref 70–99)
Potassium: 3.9 mmol/L (ref 3.5–5.2)
Sodium: 142 mmol/L (ref 134–144)
eGFR: 103 mL/min/{1.73_m2} (ref >59)

## 2023-03-02 LAB — HEMOGLOBIN A1C
Est. average glucose Bld gHb Est-mCnc: 131 mg/dL
Hgb A1c MFr Bld: 6.2 % — ABNORMAL HIGH (ref 4.8–5.6)

## 2023-03-02 LAB — URIC ACID: Uric Acid: 7.1 mg/dL (ref 3.0–7.2)

## 2023-03-02 LAB — HEPATITIS C ANTIBODY: Hep C Virus Ab: NONREACTIVE

## 2023-03-02 LAB — MAGNESIUM: Magnesium: 1.8 mg/dL (ref 1.6–2.3)

## 2023-03-02 MED ORDER — METFORMIN HCL ER 750 MG PO TB24: 750.0000 mg | ORAL_TABLET | Freq: Every day | ORAL | 1 refills | Status: DC

## 2023-03-02 NOTE — Progress Notes (Signed)
Pre-diabetes remains; is increased from previously. Would recommend metformin to assist with prevention to diabetes and assist with possible correlation with UTIs. Uric acid levels borderline; could have been worse and in association with cramping symptoms as blood chemistry shows stable potassium and chloride. Will add diet information to your chart. Magnesium is low; recommend restart of oral supplementation. 800 mg BID, OTC.

## 2023-03-07 ENCOUNTER — Ambulatory Visit (INDEPENDENT_AMBULATORY_CARE_PROVIDER_SITE_OTHER): Payer: 59 | Admitting: Obstetrics

## 2023-03-07 ENCOUNTER — Other Ambulatory Visit (HOSPITAL_COMMUNITY)
Admission: RE | Admit: 2023-03-07 | Discharge: 2023-03-07 | Disposition: A | Discharge status: Home / Self Care | Payer: 59 | Source: Ambulatory Visit | Attending: Obstetrics | Admitting: Obstetrics

## 2023-03-07 ENCOUNTER — Other Ambulatory Visit: Payer: Self-pay | Admitting: Obstetrics

## 2023-03-07 ENCOUNTER — Encounter: Payer: Self-pay | Admitting: Obstetrics

## 2023-03-07 VITALS — BP 133/83 | HR 86 | Ht 68.0 in | Wt 262.0 lb

## 2023-03-07 DIAGNOSIS — N951 Menopausal and female climacteric states: Secondary | ICD-10-CM | POA: Diagnosis not present

## 2023-03-07 DIAGNOSIS — N898 Other specified noninflammatory disorders of vagina: Secondary | ICD-10-CM | POA: Insufficient documentation

## 2023-03-07 DIAGNOSIS — Z7689 Persons encountering health services in other specified circumstances: Secondary | ICD-10-CM

## 2023-03-07 MED ORDER — CLONIDINE HCL 0.2 MG PO TABS: 0.2000 mg | ORAL_TABLET | Freq: Two times a day (BID) | ORAL | 12 refills | Status: DC

## 2023-03-07 MED ORDER — PREMARIN 0.625 MG/GM VA CREA: 0.7500 | TOPICAL_CREAM | Freq: Every day | VAGINAL | 12 refills | Status: DC

## 2023-03-07 NOTE — Progress Notes (Signed)
   GYN ENCOUNTER  Subjective  HPI: Rhonda Williamson is a 57 y.o. [id number] who presents today for vaginal odor and discharge. She reports that since April, she has gotten several yeast infections. Since treating them, she has had a vaginal odor that comes and goes. She occasionally has discharge associated with the odor. She states that this is a new problem for her. She is sexually active with her husband, and they use condoms most of the time.  She also reports hot flashes that happen several times during the day but are worse at night. This was a problem for her briefly in her mid-30s and again in her mid-40s, and started again around age 40. She had a hysterectomy in her early 30s but still has her ovaries. She also reports bothersome vaginal dryness.  Past Medical History:  Diagnosis Date   Hypertension    Sleep apnea    Past Surgical History:  Procedure Laterality Date   ABDOMINAL HYSTERECTOMY  1998   BUNIONECTOMY  2005   CESAREAN SECTION  1993   COLONOSCOPY WITH PROPOFOL N/A 03/25/2021   Procedure: COLONOSCOPY WITH PROPOFOL;  Surgeon: Wyline Mood, MD;  Location: [facility] ENDOSCOPY;  Service: Gastroenterology;  Laterality: N/A;   COLONOSCOPY WITH PROPOFOL N/A 03/29/2021   Procedure: COLONOSCOPY WITH PROPOFOL;  Surgeon: Wyline Mood, MD;  Location[facility] ENDOSCOPY;  Service: Gastroenterology;  Laterality: N/A;   TUBAL LIGATION  1993   OB History     Gravida  3   Para      Term      Preterm      AB  2   Living  1      SAB      IAB      Ectopic      Multiple      Live Births  1          Allergies  Allergen Reactions   Latex Itching    Constitutional: Denied constitutional symptoms, night sweats, recent illness, fatigue, fever, insomnia and weight loss.  Gastrointestinal: Denied, gastro-esophageal reflux, melena, nausea and vomiting.  Genitourinary: See HPI  Musculoskeletal: Denied musculoskeletal symptoms, stiffness, swelling, muscle weakness and myalgia.   Dermatologic: Denied dermatology symptoms, rash and scar.  Neurologic: Denied neurology symptoms, dizziness, headache, neck pain and syncope.  Psychiatric: Denied psychiatric symptoms, anxiety and depression.  Endocrine: +hot flashes and night sweats    Objective  BP 133/83   Pulse 86   Ht 5\' 8"  (1.727 m)   Wt 262 lb (118.8 kg)   LMP 07/19/1995 (Approximate)   BMI 39.84 kg/m   Physical examination   Pelvic:   Vulva: Normal appearance.  No lesions.  Vagina: No lesions or abnormalities noted. Thin grayish-white discharge noted  Support: Normal pelvic support.  Urethra No masses tenderness or scarring.  Meatus Normal size without lesions or prolapse.   Assessment 1) Vaginal odor/discharge 2) Hot flashes 3) Vaginal dryness 4) Elevated BP  Plan 1) Discussed possible causes of vaginal odor. Swabs collected. Boric acid samples given. Will treat based on results. 2) Discussed options and risks of HRT, non-hormonal options. Rhonda Williamson would like to try a non-hormonal options. Clonidine 0.2 mg PO BID prescribed. 3) Discussed vaginal moisturizer or estrogen cream. Rhonda Williamson would like to try estrogen cream. Rx for Premarin sent to pharmacy. 4) Recommended f/u with PCP.   Rhonda Williamson, CNM

## 2023-03-09 LAB — CERVICOVAGINAL ANCILLARY ONLY
Bacterial Vaginitis (gardnerella): POSITIVE — AB
Candida Glabrata: NEGATIVE
Candida Vaginitis: POSITIVE — AB
Chlamydia: NEGATIVE
Comment: NEGATIVE
Comment: NEGATIVE
Comment: NEGATIVE
Comment: NEGATIVE
Comment: NEGATIVE
Comment: NORMAL
Neisseria Gonorrhea: NEGATIVE
Trichomonas: NEGATIVE

## 2023-03-09 MED ORDER — ESTRADIOL 0.1 MG/GM VA CREA: 1.0000 | TOPICAL_CREAM | Freq: Every day | VAGINAL | 12 refills | Status: DC

## 2023-03-11 ENCOUNTER — Other Ambulatory Visit: Payer: Self-pay | Admitting: Obstetrics

## 2023-03-11 ENCOUNTER — Encounter: Payer: Self-pay | Admitting: Obstetrics

## 2023-03-11 MED ORDER — METRONIDAZOLE 500 MG PO TABS
500.0000 mg | ORAL_TABLET | Freq: Two times a day (BID) | ORAL | 0 refills | Status: DC
Start: 2023-03-11 — End: 2023-07-31

## 2023-03-11 MED ORDER — FLUCONAZOLE 150 MG PO TABS: 150.0000 mg | ORAL_TABLET | ORAL | 0 refills | Status: DC

## 2023-03-11 NOTE — Progress Notes (Unsigned)
+  BV and yeast. Rx for metronidazole and diflucan sent to pharmacy on file. [place] notified via [employer].  Glenetta Borg, CNM

## 2023-03-13 ENCOUNTER — Ambulatory Visit (INDEPENDENT_AMBULATORY_CARE_PROVIDER_SITE_OTHER): Payer: 59 | Admitting: Podiatry

## 2023-03-13 ENCOUNTER — Ambulatory Visit: Payer: 59

## 2023-03-13 DIAGNOSIS — R2242 Localized swelling, mass and lump, left lower limb: Secondary | ICD-10-CM

## 2023-03-13 DIAGNOSIS — Z01818 Encounter for other preprocedural examination: Secondary | ICD-10-CM | POA: Diagnosis not present

## 2023-03-13 DIAGNOSIS — M79672 Pain in left foot: Secondary | ICD-10-CM | POA: Diagnosis not present

## 2023-03-13 DIAGNOSIS — M898X9 Other specified disorders of bone, unspecified site: Secondary | ICD-10-CM

## 2023-03-13 NOTE — Progress Notes (Signed)
Subjective:  Patient ID: Rhonda Williamson, female    DOB: 1966/08/24,  MRN: [id number]  Chief Complaint  Patient presents with   Foot Pain    57 y.o. female presents with the above complaint. Patient presents with new complaint of left midfoot exostosis with underlying pain.  Patient states been present for quite some time is progressive gotten worse.  It goes up and down in size.  She has not seen anyone else prior to seeing me she would like to discuss treatment options for this.  Pain scale 7 out of 10.  She has tried all conservative treatment options including padding shoe gear modification protecting none of which has helped.  She would like to discuss removal of this.   Review of Systems: Negative except as noted in the HPI. Denies N/V/F/Ch.  Past Medical History:  Diagnosis Date   Hypertension    Sleep apnea     Current Outpatient Medications:    acetaminophen (TYLENOL) 325 MG tablet, Take 2 tablets (650 mg total) by mouth every 6 (six) hours as needed for mild pain or headache (fever >/= 101). (Patient not taking: Reported on 03/20/2023), Disp:  , Rfl:    albuterol (VENTOLIN HFA) 108 (90 Base) MCG/ACT inhaler, Inhale 2 puffs into the lungs every 6 (six) hours as needed for wheezing or shortness of breath., Disp: 6.7 g, Rfl: 2   amLODipine-benazepril (LOTREL) 10-40 MG capsule, Take 1 capsule by mouth daily., Disp: 90 capsule, Rfl: 2   cetirizine (ZYRTEC) 10 MG tablet, Take 1 tablet (10 mg total) by mouth daily., Disp: 90 tablet, Rfl: 1   cloNIDine (CATAPRES) 0.2 MG tablet, Take 1 tablet (0.2 mg total) by mouth 2 (two) times daily. (Patient not taking: Reported on 03/20/2023), Disp: 60 tablet, Rfl: 12   estradiol (ESTRACE) 0.1 MG/GM vaginal cream, Place 1 Applicatorful vaginally at bedtime. (Patient not taking: Reported on 03/20/2023), Disp: 42.5 g, Rfl: 12   fluconazole (DIFLUCAN) 150 MG tablet, Take 1 tablet (150 mg total) by mouth every 3 (three) days. Take one tablet. Repeat dose in  3 days if symptoms have not improved. (Patient not taking: Reported on 03/20/2023), Disp: 2 tablet, Rfl: 0   fluticasone (FLONASE) 50 MCG/ACT nasal spray, Spray two sprays into each nostril daily, Disp: 48 mL, Rfl: 3   hydrochlorothiazide (MICROZIDE) 12.5 MG capsule, Take 1 capsule (12.5 mg total) by mouth daily. (Patient not taking: Reported on 03/20/2023), Disp: 180 capsule, Rfl: 1   metFORMIN (GLUCOPHAGE-XR) 750 MG 24 hr tablet, Take 1 tablet (750 mg total) by mouth daily with breakfast., Disp: 90 tablet, Rfl: 1   metroNIDAZOLE (FLAGYL) 500 MG tablet, Take 1 tablet (500 mg total) by mouth 2 (two) times daily., Disp: 14 tablet, Rfl: 0   phenazopyridine (PYRIDIUM) 100 MG tablet, Take 1 tablet (100 mg total) by mouth 3 (three) times daily as needed for pain. (Patient not taking: Reported on 03/07/2023), Disp: 10 tablet, Rfl: 0  Social History   Tobacco Use  Smoking Status Former  Smokeless Tobacco Never  Tobacco Comments   QUIT IN 2004    Allergies  Allergen Reactions   Latex Itching   Objective:  There were no vitals filed for this visit. There is no height or weight on file to calculate BMI. Constitutional Well developed. Well nourished.  Vascular Dorsalis pedis pulses palpable bilaterally. Posterior tibial pulses palpable bilaterally. Capillary refill normal to all digits.  No cyanosis or clubbing noted. Pedal hair growth normal.  Neurologic Normal speech. Oriented to  person, place, and time. Epicritic sensation to light touch grossly present bilaterally.  Dermatologic Nails well groomed and normal in appearance. No open wounds. No skin lesions.  Orthopedic: Pain on palpation left dorsal midfoot pain with range of motion of the tarsometatarsal joint underlying clinical arthritis appreciated.  Negative extensor tendinitis noted.   Radiographs: 3 views of skeletally mature adult left foot: Exostosis noted at the midfoot consistent with arthritis no other bony abnormalities  identified.  Pes planovalgus foot structure noted Assessment:   1. Bony exostosis   2. Pain in left foot   3. Encounter for preoperative examination for general surgical procedure    Plan:  Patient was evaluated and treated and all questions answered.  Left midfoot exostosis -All questions and concerns were discussed with the patient in extensive detail given that she has failed all conservative care including padding shoe gear modification she would benefit from removal of the exostosis from the midfoot.  I discussed with her there is a chance of recurrence associated with it given that this is originating from the underlying arthritis -I discussed my preoperative intra or postoperative plan in extensive detail she states understand like to proceed with surgery -Informed surgical risk consent was reviewed and read aloud to the patient.  I reviewed the films.  I have discussed my findings with the patient in great detail.  I have discussed all risks including but not limited to infection, stiffness, scarring, limp, disability, deformity, damage to blood vessels and nerves, numbness, poor healing, need for braces, arthritis, chronic pain, amputation, death.  All benefits and realistic expectations discussed in great detail.  I have made no promises as to the outcome.  I have provided realistic expectations.  I have offered the patient a 2nd opinion, which they have declined and assured me they preferred to proceed despite the risks   No follow-ups on file.

## 2023-03-14 ENCOUNTER — Ambulatory Visit: Payer: Self-pay

## 2023-03-14 NOTE — Telephone Encounter (Signed)
Patient advised. Verbalized understanding

## 2023-03-14 NOTE — Telephone Encounter (Signed)
Clonidine is a blood pressure lowering agent so it can have additive effects with the current blood pressure medication she is taking.  Patient can proceed with clonidine prescription for menopausal symptoms with note that it can affect blood pressure so she should monitor blood pressure for low levels that are consistently less than 100/60 or look for development of lightheadedness or dizziness.  If she experiences the symptoms, recommend stopping the clonidine and notifying our office.  [name], MD  [facility]

## 2023-03-14 NOTE — Telephone Encounter (Signed)
Pt stated she went to OBGYN and was given medication cloNIDine (CATAPRES) 0.2 MG tablet and wants to make sure with PCP that it is okay for her to take it.   Chief Complaint: States OB/GYN put her on clonidine "I think for my hot flashes." Wants to know from PCP if it is safe to take with her other medications. Symptoms: n/a Frequency: n/a Pertinent Negatives: Patient denies  Disposition: [] ED /[] Urgent Care (no appt availability in office) / [] Appointment(In office/virtual)/ []  [place] [facility]/ [] Home Care/ [] Refused Recommended Disposition /[] [place] Mobile Bus/ [x]  Follow-up with PCP Additional Notes: Please advise pt.  Answer Assessment - Initial Assessment Questions 1. NAME of MEDICINE: "What medicine(s) are you calling about?"     Clonidine  2. QUESTION: "What is your question?" (e.g., double dose of medicine, side effect)     Is it ok to take this medication? 3. PRESCRIBER: "Who prescribed the medicine?" Reason: if prescribed by specialist, call should be referred to that group.     OB/GYN 4. SYMPTOMS: "Do you have any symptoms?" If Yes, ask: "What symptoms are you having?"  "How bad are the symptoms (e.g., mild, moderate, severe)     N/a 5. PREGNANCY:  "Is there any chance that you are pregnant?" "When was your last menstrual period?"     no  Protocols used: Medication Question Call-A-AH

## 2023-03-20 ENCOUNTER — Encounter: Payer: Self-pay | Admitting: Family Medicine

## 2023-03-20 ENCOUNTER — Ambulatory Visit (INDEPENDENT_AMBULATORY_CARE_PROVIDER_SITE_OTHER): Payer: 59 | Admitting: Family Medicine

## 2023-03-20 VITALS — BP 136/78 | HR 72 | Ht 68.0 in | Wt 262.0 lb

## 2023-03-20 DIAGNOSIS — Z2821 Immunization not carried out because of patient refusal: Secondary | ICD-10-CM | POA: Diagnosis not present

## 2023-03-20 DIAGNOSIS — J302 Other seasonal allergic rhinitis: Secondary | ICD-10-CM

## 2023-03-20 DIAGNOSIS — Z0000 Encounter for general adult medical examination without abnormal findings: Secondary | ICD-10-CM

## 2023-03-20 DIAGNOSIS — Z23 Encounter for immunization: Secondary | ICD-10-CM | POA: Diagnosis not present

## 2023-03-20 DIAGNOSIS — G4733 Obstructive sleep apnea (adult) (pediatric): Secondary | ICD-10-CM

## 2023-03-20 DIAGNOSIS — J4520 Mild intermittent asthma, uncomplicated: Secondary | ICD-10-CM

## 2023-03-20 DIAGNOSIS — I10 Essential (primary) hypertension: Secondary | ICD-10-CM

## 2023-03-20 MED ORDER — FLUTICASONE PROPIONATE 50 MCG/ACT NA SUSP: NASAL | 3 refills | Status: DC

## 2023-03-20 MED ORDER — ALBUTEROL SULFATE HFA 108 (90 BASE) MCG/ACT IN AERS
2.0000 | INHALATION_SPRAY | Freq: Four times a day (QID) | RESPIRATORY_TRACT | 2 refills | Status: AC | PRN
Start: 2023-03-20 — End: ?

## 2023-03-20 NOTE — Assessment & Plan Note (Signed)
Chronic conditions are stable  Patient was counseled on benefits of regular physical activity with goal of 150 minutes of moderate to vigurous intensity 4 days per week  Patient was counseled to consume well balanced diet of fruits, vegetables, limited saturated fats and limited sugary foods and beverages with emphasis on consuming 6-8 glasses of water daily  Screening recommended today: lipids, TSH,CBC Vaccines recommended today: Shingrix vaccine, patient declined covid vaccine   DC pap smear

## 2023-03-20 NOTE — Assessment & Plan Note (Signed)
Chronic  Stable with CPAP use  Patient advised to continue CPAP nightly  The patient has had significant benefit from the use of CPAP machine with considerable improvements in quality of life, work production and decrease medical complaints.  The patient will need continued maintenance and care CPAP supplies (including upgrades as deemed appropriate) in order to continue treatment for sleep apnea as this is medically necessary.     

## 2023-03-20 NOTE — Progress Notes (Signed)
I,Sha'taria Tyson,acting as a Neurosurgeon for [employer], MD.,have documented all relevant documentation on the behalf of [name], MD,as directed by  [name], MD while in the presence of [name], MD.   Complete physical exam   Patient: Rhonda Williamson   DOB: [birth year]   57 y.o. Female  MRN: [id number] Visit Date: 03/20/2023  Today's healthcare provider: [name], MD   No chief complaint on file.  Subjective    Rhonda Williamson is a 57 y.o. female who presents today for a complete physical exam.   She reports consuming a general diet but cutting back on portion and making sure well balanced.   The patient reports she goes walking daily for at least 40-45 minutes.  She generally feels well. She reports sleeping well. She does not have additional problems to discuss today.   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    Past Medical History:  Diagnosis Date   Hypertension    Sleep apnea    Past Surgical History:  Procedure Laterality Date   ABDOMINAL HYSTERECTOMY  1998   BUNIONECTOMY  2005   CESAREAN SECTION  1993   COLONOSCOPY WITH PROPOFOL N/A 03/25/2021   Procedure: COLONOSCOPY WITH PROPOFOL;  Surgeon: Wyline Mood, MD;  Location: [facility] ENDOSCOPY;  Service: Gastroenterology;  Laterality: N/A;   COLONOSCOPY WITH PROPOFOL N/A 03/29/2021   Procedure: COLONOSCOPY WITH PROPOFOL;  Surgeon: Wyline Mood, MD;  Location[facility] END[facility];  Service:  Gastroenterology;  Laterality: N/A;   TUBAL LIGATION  1993   Social History   Socioeconomic History   Marital status: Married    Spouse name: Not on file   Number of children: Not on file   Years of education: Not on file   Highest education level: Not on file  Occupational History   Not on file  Tobacco Use   Smoking status: Former   Smokeless tobacco: Never   Tobacco comments:    QUIT IN 2004  Vaping Use   Vaping Use: Never used  Substance and Sexual Activity   Alcohol use: No    Alcohol/week: 0.0 standard drinks of alcohol   Drug use: No   Sexual activity: Yes  Other Topics Concern   Not on file  Social History Narrative   Not on file   Social Determinants of Health   Financial Resource Strain: Not on file  Food Insecurity: Not on file  Transportation Needs: Not on file  Physical Activity: Not on file  Stress: Not on file  Social Connections: Not on file  Intimate Partner Violence: Not on file   Family Status  Relation Name Status   Mother  Deceased at age 46   Father  Deceased at age 74   Sister 1 Deceased   Brother 3 Alive   Sister 2 Deceased at age 97       died of cancer on 05-01-23   Brother 1 Deceased       ACCIDENTAL DEATH  Brother 2 Deceased   Sister 3 Alive       BREAST CYST   Sister 4 Alive   Neg Hx  (Not Specified)   Family History  Problem Relation Age of Onset   Ovarian cancer Mother    Lung cancer Father    Lung cancer Sister    Bone cancer Sister    CVA Sister    Emphysema Brother    Hypertension Sister    Breast cancer Neg Hx    Allergies  Allergen Reactions   Latex Itching    Patient Care Team: [name], MD as PCP - General (Family Medicine) [place], Carlye Grippe, RN Glenetta Borg, CNM as Midwife (Obstetrics)   Medications: Outpatient Medications Prior to Visit  Medication Sig   amLODipine-benazepril (LOTREL) 10-40 MG capsule Take 1 capsule by mouth daily.   cetirizine (ZYRTEC) 10 MG tablet  Take 1 tablet (10 mg total) by mouth daily.   metFORMIN (GLUCOPHAGE-XR) 750 MG 24 hr tablet Take 1 tablet (750 mg total) by mouth daily with breakfast.   metroNIDAZOLE (FLAGYL) 500 MG tablet Take 1 tablet (500 mg total) by mouth 2 (two) times daily.   [DISCONTINUED] albuterol (VENTOLIN HFA) 108 (90 Base) MCG/ACT inhaler Inhale 2 puffs into the lungs every 6 (six) hours as needed for wheezing or shortness of breath.   [DISCONTINUED] fluticasone (FLONASE) 50 MCG/ACT nasal spray Spray two sprays into each nostril daily   acetaminophen (TYLENOL) 325 MG tablet Take 2 tablets (650 mg total) by mouth every 6 (six) hours as needed for mild pain or headache (fever >/= 101). (Patient not taking: Reported on 03/20/2023)   cloNIDine (CATAPRES) 0.2 MG tablet Take 1 tablet (0.2 mg total) by mouth 2 (two) times daily. (Patient not taking: Reported on 03/20/2023)   estradiol (ESTRACE) 0.1 MG/GM vaginal cream Place 1 Applicatorful vaginally at bedtime. (Patient not taking: Reported on 03/20/2023)   fluconazole (DIFLUCAN) 150 MG tablet Take 1 tablet (150 mg total) by mouth every 3 (three) days. Take one tablet. Repeat dose in 3 days if symptoms have not improved. (Patient not taking: Reported on 03/20/2023)   hydrochlorothiazide (MICROZIDE) 12.5 MG capsule Take 1 capsule (12.5 mg total) by mouth daily. (Patient not taking: Reported on 03/20/2023)   phenazopyridine (PYRIDIUM) 100 MG tablet Take 1 tablet (100 mg total) by mouth 3 (three) times daily as needed for pain. (Patient not taking: Reported on 03/07/2023)   No facility-administered medications prior to visit.    Review of Systems    Objective    BP 136/78 (BP Location: Left Arm, Patient Position: Sitting, Cuff Size: Large)   Pulse 72   Ht 5\' 8"  (1.727 m)   Wt 262 lb (118.8 kg)   LMP 07/19/1995 (Approximate)   BMI 39.84 kg/m     Physical Exam Vitals reviewed.  Constitutional:      General: She is not in acute distress.    Appearance: Normal appearance.  She is not ill-appearing, toxic-appearing or diaphoretic.  Eyes:     Conjunctiva/sclera: Conjunctivae normal.  Cardiovascular:     Rate and Rhythm: Normal rate and regular rhythm.     Pulses: Normal pulses.     Heart sounds: Normal heart sounds. No murmur heard.    No friction rub. No gallop.  Pulmonary:     Effort: Pulmonary effort is normal. No respiratory distress.     Breath sounds: Normal breath sounds. No stridor. No wheezing, rhonchi or rales.  Abdominal:     General: Bowel sounds  are normal. There is no distension.     Palpations: Abdomen is soft.     Tenderness: There is no abdominal tenderness.  Musculoskeletal:     Right lower leg: Edema present.     Left lower leg: Edema present.     Comments: 1+ edema bilaterally   Skin:    Findings: No erythema or rash.     Comments: Multiple skin tags, decreased in size on abdomen, axillae and neck   Neurological:     Mental Status: She is alert and oriented to person, place, and time.     Cranial Nerves: Cranial nerves 2-12 are intact. No cranial nerve deficit, dysarthria or facial asymmetry.     Sensory: Sensation is intact.     Gait: Gait is intact.       Last depression screening scores    03/20/2023    1:04 PM 03/01/2023    2:35 PM 02/13/2023    4:18 PM  PHQ 2/9 Scores  PHQ - 2 Score 0 0 0  PHQ- 9 Score 0 1 0   Last fall risk screening    03/20/2023    1:04 PM  Fall Risk   Falls in the past year? 0  Number falls in past yr: 0  Injury with Fall? 0  Risk for fall due to : No Fall Risks  Follow up Falls evaluation completed   Last Audit-C alcohol use screening    03/20/2023    1:04 PM  Alcohol Use Disorder Test (AUDIT)  1. How often do you have a drink containing alcohol? 0  3. How often do you have six or more drinks on one occasion? 0   A score of 3 or more in women, and 4 or more in men indicates increased risk for alcohol abuse, EXCEPT if all of the points are from question 1   No results found for any  visits on 03/20/23.  Assessment & Plan    Routine Health Maintenance and Physical Exam   Immunization History  Administered Date(s) Administered   Influenza Split 11/08/2012   Influenza,inj,Quad PF,6+ Mos 07/21/2014, 06/28/2018   Pneumococcal Polysaccharide-23 11/08/2012   Tdap 11/10/2013   Zoster Recombinat (Shingrix) 03/20/2023    Health Maintenance  Topic Date Due   COVID-19 Vaccine (1) Never done   INFLUENZA VACCINE  05/03/2023   Zoster Vaccines- Shingrix (2 of 2) 05/15/2023   DTaP/Tdap/Td (2 - Td or Tdap) 11/11/2023   MAMMOGRAM  02/22/2024   Colonoscopy  03/30/2031   Hepatitis C Screening  Completed   HIV Screening  Completed   HPV VACCINES  Aged Out    Problem List Items Addressed This Visit       Cardiovascular and Mediastinum   Essential (primary) hypertension    Chronic  Controlled BP at goal Continue lotrel 10-40mg  and hydrochlorothiazide 12.5mg   No medications changes today  CMP        Relevant Orders   Comprehensive metabolic panel   TSH + free T4     Respiratory   Obstructive apnea    Chronic  Stable with CPAP use  Patient advised to continue CPAP nightly  The patient has had significant benefit from the use of CPAP machine with considerable improvements in quality of life, work production and decrease medical complaints.  The patient will need continued maintenance and care CPAP supplies (including upgrades as deemed appropriate) in order to continue treatment for sleep apnea as this is medically necessary.  Relevant Orders   CBC     Other   Morbid obesity (HCC)    Chronic  Patient advised to participate in regular physical activities for 150 mins per week and consume well balanced meals to help with weight management and decrease A1c for pre-diabetes  CMP, lipid panel, TSH and free T4 and CBC       Relevant Orders   Lipid Profile   Comprehensive metabolic panel   TSH + free T4   Annual physical exam - Primary    Chronic  conditions are stable  Patient was counseled on benefits of regular physical activity with goal of 150 minutes of moderate to vigurous intensity 4 days per week  Patient was counseled to consume well balanced diet of fruits, vegetables, limited saturated fats and limited sugary foods and beverages with emphasis on consuming 6-8 glasses of water daily  Screening recommended today: lipids, TSH,CBC Vaccines recommended today: Shingrix vaccine, patient declined covid vaccine   DC pap smear        Other Visit Diagnoses     Mild intermittent asthma without complication       Relevant Medications   albuterol (VENTOLIN HFA) 108 (90 Base) MCG/ACT inhaler   Seasonal allergic rhinitis, unspecified trigger       Relevant Medications   fluticasone (FLONASE) 50 MCG/ACT nasal spray   COVID-19 vaccination declined       Encounter for immunization       Relevant Orders   Varicella-zoster vaccine IM (Completed)        Return in about 3 months (around 06/20/2023) for prediabetes .       The entirety of the information documented in the History of Present Illness, Review of Systems and Physical Exam were personally obtained by me. Portions of this information were initially documented by Sha'taria Tyson,CMA. I, [name], MD have reviewed the documentation above for thoroughness and accuracy.      [name], MD  [facility] [phone] (phone) [phone] (fax)  [facility]

## 2023-03-20 NOTE — Assessment & Plan Note (Addendum)
Chronic  Controlled BP at goal Continue lotrel 10-40mg  and hydrochlorothiazide 12.5mg   No medications changes today  CMP

## 2023-03-20 NOTE — Assessment & Plan Note (Signed)
Chronic  Patient advised to participate in regular physical activities for 150 mins per week and consume well balanced meals to help with weight management and decrease A1c for pre-diabetes  CMP, lipid panel, TSH and free T4 and CBC

## 2023-03-20 NOTE — Patient Instructions (Addendum)
[employer] for your bloating   Zoster Vaccine Injection What is this medication? ZOSTER VACCINE (ZOS ter vak SEEN) reduces the risk of herpes zoster (shingles). It does not treat shingles. It is still possible to get shingles after receiving the vaccine, but the symptoms may be less severe or not last as long. It works by helping your immune system learn how to fight off a future infection. This medicine may be used for other purposes; ask your health care provider or pharmacist if you have questions. COMMON BRAND NAME(S): [facility] What should I tell my care team before I take this medication? They need to know if you have any of these conditions: Cancer Immune system problems An unusual or allergic reaction to Zoster vaccine, other medications, foods, dyes, or preservatives Pregnant or trying to get pregnant Breastfeeding How should I use this medication? This vaccine is injected into a muscle. It is given by your care team. This vaccine requires 2 doses to get the full benefit. Set a reminder for when your next dose is due. A copy of Vaccine Information Statements will be given before each vaccination. Be sure to read this information carefully each time. This sheet may change often. Talk to your care team about the use of this vaccine in children. This vaccine is not approved for use in children. Overdosage: If you think you have taken too much of this medicine contact a poison control center or emergency room at once. NOTE: This medicine is only for you. Do not share this medicine with others. What if I miss a dose? Keep appointments for follow-up (booster) doses. It is important not to miss your dose. Call your care team if you are unable to keep an appointment. What may interact with this medication? Medications that suppress your immune system Medications to treat cancer Steroid medications, such as prednisone or cortisone This list may not describe all possible interactions. Give your  health care provider a list of all the medicines, herbs, non-prescription drugs, or dietary supplements you use. Also tell them if you smoke, drink alcohol, or use illegal drugs. Some items may interact with your medicine. What should I watch for while using this medication? Visit your care team regularly. This vaccine, like all vaccines, may not fully protect everyone. What side effects may I notice from receiving this medication? Side effects that you should report to your care team as soon as possible: Allergic reactions--skin rash, itching, hives, swelling of the face, lips, tongue, or throat Side effects that usually do not require medical attention (report these to your care team if they continue or are bothersome): Chills Fatigue Feeling faint or lightheaded Fever Headache Muscle pain Pain, redness, or irritation at injection site This list may not describe all possible side effects. Call your doctor for medical advice about side effects. You may report side effects to FDA at 1-800-FDA-1088. Where should I keep my medication? This vaccine is only given by your care team. It will not be stored at home. NOTE: This sheet is a summary. It may not cover all possible information. If you have questions about this medicine, talk to your doctor, pharmacist, or health care provider.  2024 Elsevier/Gold Standard (2022-03-09 00:00:00)    Health Maintenance, Female Adopting a healthy lifestyle and getting preventive care are important in promoting health and wellness. Ask your health care provider about: The right schedule for you to have regular tests and exams. Things you can do on your own to prevent diseases and keep yourself  healthy. What should I know about diet, weight, and exercise? Eat a healthy diet  Eat a diet that includes plenty of vegetables, fruits, low-fat dairy products, and lean protein. Do not eat a lot of foods that are high in solid fats, added sugars, or  sodium. Maintain a healthy weight Body mass index (BMI) is used to identify weight problems. It estimates body fat based on height and weight. Your health care provider can help determine your BMI and help you achieve or maintain a healthy weight. Get regular exercise Get regular exercise. This is one of the most important things you can do for your health. Most adults should: Exercise for at least 150 minutes each week. The exercise should increase your heart rate and make you sweat (moderate-intensity exercise). Do strengthening exercises at least twice a week. This is in addition to the moderate-intensity exercise. Spend less time sitting. Even light physical activity can be beneficial. Watch cholesterol and blood lipids Have your blood tested for lipids and cholesterol at 58 years of age, then have this test every 5 years. Have your cholesterol levels checked more often if: Your lipid or cholesterol levels are high. You are older than 58 years of age. You are at high risk for heart disease. What should I know about cancer screening? Depending on your health history and family history, you may need to have cancer screening at various ages. This may include screening for: Breast cancer. Cervical cancer. Colorectal cancer. Skin cancer. Lung cancer. What should I know about heart disease, diabetes, and high blood pressure? Blood pressure and heart disease High blood pressure causes heart disease and increases the risk of stroke. This is more likely to develop in people who have high blood pressure readings or are overweight. Have your blood pressure checked: Every 3-5 years if you are 78-74 years of age. Every year if you are 53 years old or older. Diabetes Have regular diabetes screenings. This checks your fasting blood sugar level. Have the screening done: Once every three years after age 68 if you are at a normal weight and have a low risk for diabetes. More often and at a younger  age if you are overweight or have a high risk for diabetes. What should I know about preventing infection? Hepatitis B If you have a higher risk for hepatitis B, you should be screened for this virus. Talk with your health care provider to find out if you are at risk for hepatitis B infection. Hepatitis C Testing is recommended for: Everyone born from [birth year] through 1966. Anyone with known risk factors for hepatitis C. Sexually transmitted infections (STIs) Get screened for STIs, including gonorrhea and chlamydia, if: You are sexually active and are younger than 58 years of age. You are older than 58 years of age and your health care provider tells you that you are at risk for this type of infection. Your sexual activity has changed since you were last screened, and you are at increased risk for chlamydia or gonorrhea. Ask your health care provider if you are at risk. Ask your health care provider about whether you are at high risk for HIV. Your health care provider may recommend a prescription medicine to help prevent HIV infection. If you choose to take medicine to prevent HIV, you should first get tested for HIV. You should then be tested every 3 months for as long as you are taking the medicine. Pregnancy If you are about to stop having your period (premenopausal) and you  may become pregnant, seek counseling before you get pregnant. Take 400 to 800 micrograms (mcg) of folic acid every day if you become pregnant. Ask for birth control (contraception) if you want to prevent pregnancy. Osteoporosis and menopause Osteoporosis is a disease in which the bones lose minerals and strength with aging. This can result in bone fractures. If you are 73 years old or older, or if you are at risk for osteoporosis and fractures, ask your health care provider if you should: Be screened for bone loss. Take a calcium or vitamin D supplement to lower your risk of fractures. Be given hormone replacement therapy  (HRT) to treat symptoms of menopause. Follow these instructions at home: Alcohol use Do not drink alcohol if: Your health care provider tells you not to drink. You are pregnant, may be pregnant, or are planning to become pregnant. If you drink alcohol: Limit how much you have to: 0-1 drink a day. Know how much alcohol is in your drink. In the [place] one drink equals one 12 oz bottle of beer (355 mL), one 5 oz glass of wine (148 mL), or one 1 oz glass of hard liquor (44 mL). Lifestyle Do not use any products that contain nicotine or tobacco. These products include cigarettes, chewing tobacco, and vaping devices, such as e-cigarettes. If you need help quitting, ask your health care provider. Do not use street drugs. Do not share needles. Ask your health care provider for help if you need support or information about quitting drugs. General instructions Schedule regular health, dental, and eye exams. Stay current with your vaccines. Tell your health care provider if: You often feel depressed. You have ever been abused or do not feel safe at home. Summary Adopting a healthy lifestyle and getting preventive care are important in promoting health and wellness. Follow your health care provider's instructions about healthy diet, exercising, and getting tested or screened for diseases. Follow your health care provider's instructions on monitoring your cholesterol and blood pressure. This information is not intended to replace advice given to you by your health care provider. Make sure you discuss any questions you have with your health care provider. Document Revised: 02/07/2021 Document Reviewed: 02/07/2021 Elsevier Patient Education  2024 [employer].

## 2023-03-21 LAB — COMPREHENSIVE METABOLIC PANEL
ALT: 52 IU/L — ABNORMAL HIGH (ref 0–32)
AST: 37 IU/L (ref 0–40)
Albumin: 4.4 g/dL (ref 3.8–4.9)
Alkaline Phosphatase: 72 IU/L (ref 44–121)
BUN/Creatinine Ratio: 20 (ref 9–23)
BUN: 13 mg/dL (ref 6–24)
Bilirubin Total: 0.4 mg/dL (ref 0.0–1.2)
CO2: 24 mmol/L (ref 20–29)
Calcium: 10 mg/dL (ref 8.7–10.2)
Chloride: 103 mmol/L (ref 96–106)
Creatinine, Ser: 0.66 mg/dL (ref 0.57–1.00)
Globulin, Total: 3 g/dL (ref 1.5–4.5)
Glucose: 93 mg/dL (ref 70–99)
Potassium: 3.9 mmol/L (ref 3.5–5.2)
Sodium: 142 mmol/L (ref 134–144)
Total Protein: 7.4 g/dL (ref 6.0–8.5)
eGFR: 102 mL/min/{1.73_m2} (ref >59)

## 2023-03-21 LAB — LIPID PANEL
Chol/HDL Ratio: 3 ratio (ref 0.0–4.4)
Cholesterol, Total: 126 mg/dL (ref 100–199)
HDL: 42 mg/dL (ref >39)
LDL Chol Calc (NIH): 65 mg/dL (ref 0–99)
Triglycerides: 100 mg/dL (ref 0–149)
VLDL Cholesterol Cal: 19 mg/dL (ref 5–40)

## 2023-03-21 LAB — TSH+FREE T4
Free T4: 1.34 ng/dL (ref 0.82–1.77)
TSH: 1.65 u[IU]/mL (ref 0.450–4.500)

## 2023-03-21 LAB — CBC
Hematocrit: 38.9 % (ref 34.0–46.6)
Hemoglobin: 13.1 g/dL (ref 11.1–15.9)
MCH: 29 pg (ref 26.6–33.0)
MCHC: 33.7 g/dL (ref 31.5–35.7)
MCV: 86 fL (ref 79–97)
Platelets: 279 10*3/uL (ref 150–450)
RBC: 4.52 x10E6/uL (ref 3.77–5.28)
RDW: 12.5 % (ref 11.7–15.4)
WBC: 8.2 10*3/uL (ref 3.4–10.8)

## 2023-04-17 ENCOUNTER — Other Ambulatory Visit: Payer: Self-pay | Admitting: Family Medicine

## 2023-04-17 ENCOUNTER — Telehealth: Payer: Self-pay | Admitting: Podiatry

## 2023-04-17 DIAGNOSIS — Z1231 Encounter for screening mammogram for malignant neoplasm of breast: Secondary | ICD-10-CM

## 2023-04-17 NOTE — Telephone Encounter (Signed)
DOS - 05/07/2023  TARSAL EXOSTECTOMY - 40981  [employer] EFFECTIVE DATE - 10/02/2022  DED $3500 W/  $1310.40 REMAINING OOP $6350 W/ $4147.90 REMAINING COINS 20%  PER [employer] WEBSITE, CPT CODE 19147 DOES NOT REQUIRE PRIOR AUTHORIZATION.   REFERENCE # [id number]

## 2023-04-25 ENCOUNTER — Ambulatory Visit
Admission: RE | Admit: 2023-04-25 | Discharge: 2023-04-25 | Disposition: A | Discharge status: Home / Self Care | Payer: 59 | Source: Ambulatory Visit | Attending: Family Medicine | Admitting: Family Medicine

## 2023-04-25 DIAGNOSIS — Z1231 Encounter for screening mammogram for malignant neoplasm of breast: Secondary | ICD-10-CM | POA: Diagnosis not present

## 2023-04-27 ENCOUNTER — Other Ambulatory Visit: Payer: Self-pay | Admitting: Family Medicine

## 2023-04-27 DIAGNOSIS — R928 Other abnormal and inconclusive findings on diagnostic imaging of breast: Secondary | ICD-10-CM

## 2023-04-30 ENCOUNTER — Telehealth: Payer: Self-pay | Admitting: Podiatry

## 2023-04-30 NOTE — Telephone Encounter (Signed)
Pt asked about taking her medication on the day of surgery

## 2023-05-03 ENCOUNTER — Ambulatory Visit
Admission: RE | Admit: 2023-05-03 | Discharge: 2023-05-03 | Disposition: A | Discharge status: Home / Self Care | Payer: 59 | Source: Ambulatory Visit | Attending: Family Medicine | Admitting: Family Medicine

## 2023-05-03 DIAGNOSIS — R928 Other abnormal and inconclusive findings on diagnostic imaging of breast: Secondary | ICD-10-CM | POA: Insufficient documentation

## 2023-05-04 ENCOUNTER — Ambulatory Visit: Payer: Self-pay

## 2023-05-04 ENCOUNTER — Other Ambulatory Visit: Payer: Self-pay | Admitting: Family Medicine

## 2023-05-04 MED ORDER — FOSFOMYCIN TROMETHAMINE 3 G PO PACK: 3.0000 g | PACK | Freq: Once | ORAL | 0 refills | Status: AC

## 2023-05-04 NOTE — Telephone Encounter (Signed)
Chief Complaint: UTI symptoms Symptoms: Frequency in urination  Frequency: onset yesterday Pertinent Negatives: Patient denies pain, burning, discharge Disposition: [] ED /[] Urgent Care (no appt availability in office) / [] Appointment(In office/virtual)/ []  [facility] [facility]/ [] Home Care/ [] Refused Recommended Disposition /[] [place] [facility]/ [x]  Follow-up with PCP Additional Notes: Patient stated she noticed an increase in urination yesterday and it has gotten worse today. Patient reports that she has frequent UTIs and is requesting medication be sent to [facility]  in [place] on [address]. Also requested that a medication be sent for a yeast infection if she is treated with an antibiotic. I advised patient that she will need to be evaluated for her symptoms but patient declined an appointment at this time. Patient stated that she works in [place] and can not do an appointment right now. Advised patient that I would forward message to provider for additional recommendations. Patient verbalized understanding.  Summary: UTI?   Patient said she started having urinary urgency and discomfort yesterday and is requesting an antibiotic. Patient states she gets frequent utis.     Reason for Disposition  Urinating more frequently than usual (i.e., frequency)  Answer Assessment - Initial Assessment Questions 1. SYMPTOM: "What's the main symptom you're concerned about?" (e.g., frequency, incontinence)     Frequency in urination  2. ONSET: "When did the  frequency  start?"     Yesterday 3. PAIN: "Is there any pain?" If Yes, ask: "How bad is it?" (Scale: 1-10; mild, moderate, severe)     No 4. CAUSE: "What do you think is causing the symptoms?"     UTI 5. OTHER SYMPTOMS: "Do you have any other symptoms?" (e.g., blood in urine, fever, flank pain, pain with urination)     No  Protocols used: Urinary Symptoms-A-AH

## 2023-05-04 NOTE — Telephone Encounter (Signed)
Please advise 

## 2023-05-04 NOTE — Telephone Encounter (Signed)
Patient advised. Verbalized understanding 

## 2023-05-07 ENCOUNTER — Other Ambulatory Visit: Payer: Self-pay | Admitting: Podiatry

## 2023-05-07 DIAGNOSIS — M898X9 Other specified disorders of bone, unspecified site: Secondary | ICD-10-CM | POA: Diagnosis not present

## 2023-05-07 MED ORDER — OXYCODONE-ACETAMINOPHEN 5-325 MG PO TABS: 1.0000 | ORAL_TABLET | ORAL | 0 refills | Status: DC | PRN

## 2023-05-07 MED ORDER — IBUPROFEN 800 MG PO TABS: 800.0000 mg | ORAL_TABLET | Freq: Four times a day (QID) | ORAL | 1 refills | Status: DC | PRN

## 2023-05-10 ENCOUNTER — Other Ambulatory Visit: Payer: Self-pay | Admitting: Family Medicine

## 2023-05-11 NOTE — Telephone Encounter (Signed)
Requested Prescriptions  Pending Prescriptions Disp Refills   cetirizine (ZYRTEC) 10 MG tablet [Pharmacy Med Name: CETIRIZINE HCL 10 MG TABLET] 90 tablet 3    Sig: TAKE 1 TABLET BY MOUTH EVERY DAY     Ear, Nose, and Throat:  Antihistamines 2 Passed - 05/10/2023  2:42 AM      Passed - Cr in normal range and within 360 days    Creatinine, Ser  Date Value Ref Range Status  03/20/2023 0.66 0.57 - 1.00 mg/dL Final         Passed - Valid encounter within last 12 months    Recent Outpatient Visits           1 month ago Annual physical exam   [facility] Simmons-Robinson, [place], MD   2 months ago Generalized edema   [facility] Merita Norton T, FNP   2 months ago Acro[place]ordon   [facility] Simmons-Robinson, [place], MD   3 months ago Essential (primary) hypertension   [facility] Simmons-Robinson, [place], MD   4 months ago Essential (primary) hypertension   [facility] Simmons-Robinson, Financial controller, MD       Future Appointments             In 5 days Simmons-Robinson, Tawanna [place], MD [facility]   In 1 month Simmons-Robinson, [place], MD [facility], [place]

## 2023-05-15 ENCOUNTER — Ambulatory Visit: Payer: 59 | Admitting: Family Medicine

## 2023-05-15 ENCOUNTER — Ambulatory Visit (INDEPENDENT_AMBULATORY_CARE_PROVIDER_SITE_OTHER): Payer: 59 | Admitting: Podiatry

## 2023-05-15 DIAGNOSIS — M898X9 Other specified disorders of bone, unspecified site: Secondary | ICD-10-CM

## 2023-05-15 DIAGNOSIS — Z9889 Other specified postprocedural states: Secondary | ICD-10-CM

## 2023-05-15 NOTE — Progress Notes (Unsigned)
      Established patient visit   Patient: Rhonda Williamson   DOB: [birth year]   57 y.o. Female  MRN: [id number] Visit Date: 05/16/2023  Today's healthcare provider: [name], MD   No chief complaint on file.  Subjective       Discussed the use of AI scribe software for clinical note transcription with the patient, who gave verbal consent to proceed.  History of Present Illness             Medications: Outpatient Medications Prior to Visit  Medication Sig   acetaminophen (TYLENOL) 325 MG tablet Take 2 tablets (650 mg total) by mouth every 6 (six) hours as needed for mild pain or headache (fever >/= 101). (Patient not taking: Reported on 03/20/2023)   albuterol (VENTOLIN HFA) 108 (90 Base) MCG/ACT inhaler Inhale 2 puffs into the lungs every 6 (six) hours as needed for wheezing or shortness of breath.   amLODipine-benazepril (LOTREL) 10-40 MG capsule Take 1 capsule by mouth daily.   cetirizine (ZYRTEC) 10 MG tablet TAKE 1 TABLET BY MOUTH EVERY DAY   cloNIDine (CATAPRES) 0.2 MG tablet Take 1 tablet (0.2 mg total) by mouth 2 (two) times daily. (Patient not taking: Reported on 03/20/2023)   estradiol (ESTRACE) 0.1 MG/GM vaginal cream Place 1 Applicatorful vaginally at bedtime. (Patient not taking: Reported on 03/20/2023)   fluconazole (DIFLUCAN) 150 MG tablet Take 1 tablet (150 mg total) by mouth every 3 (three) days. Take one tablet. Repeat dose in 3 days if symptoms have not improved. (Patient not taking: Reported on 03/20/2023)   fluticasone (FLONASE) 50 MCG/ACT nasal spray Spray two sprays into each nostril daily   hydrochlorothiazide (MICROZIDE) 12.5 MG capsule Take 1 capsule (12.5 mg total) by mouth daily. (Patient not taking: Reported on 03/20/2023)   ibuprofen (ADVIL) 800 MG tablet Take 1 tablet (800 mg total) by mouth every 6 (six) hours as needed.   metFORMIN (GLUCOPHAGE-XR) 750 MG 24 hr tablet Take 1 tablet (750 mg total) by mouth daily with breakfast.    metroNIDAZOLE (FLAGYL) 500 MG tablet Take 1 tablet (500 mg total) by mouth 2 (two) times daily.   oxyCODONE-acetaminophen (PERCOCET) 5-325 MG tablet Take 1 tablet by mouth every 4 (four) hours as needed for severe pain.   phenazopyridine (PYRIDIUM) 100 MG tablet Take 1 tablet (100 mg total) by mouth 3 (three) times daily as needed for pain. (Patient not taking: Reported on 03/07/2023)   No facility-administered medications prior to visit.    Review of Systems  {Insert previous labs (optional):23779} {See past labs  Heme  Chem  Endocrine  Serology  Results Review (optional):1}   Objective    LMP 07/19/1995 (Approximate)  {Insert last BP/Wt (optional):23777}{See vitals history (optional):1}   Physical Exam  ***  No results found for any visits on 05/16/23.  Assessment & Plan     Problem List Items Addressed This Visit   None   Assessment and Plan              No follow-ups on file.         [name], MD  [facility] [phone] (phone) [phone] (fax)  [facility]

## 2023-05-15 NOTE — Progress Notes (Signed)
Subjective:  Patient ID: Rhonda Williamson, female    DOB: 1966/06/15,  MRN: [id number]  Chief Complaint  Patient presents with   Routine Post Op    POV # 1 DOS 05/07/23 --- LEFT MIDFOOT EXOSTECTOMY    DOS: 05/07/2023 Procedure: Left midfoot exostectomy  57 y.o. female returns for post-op check.  Patient states she is doing well.  Denies any other acute complaints.  Denies any nausea fever chills vomiting bandages clean dry and intact  Review of Systems: Negative except as noted in the HPI. Denies N/V/F/Ch.  Past Medical History:  Diagnosis Date   Hypertension    Sleep apnea     Current Outpatient Medications:    acetaminophen (TYLENOL) 325 MG tablet, Take 2 tablets (650 mg total) by mouth every 6 (six) hours as needed for mild pain or headache (fever >/= 101). (Patient not taking: Reported on 03/20/2023), Disp:  , Rfl:    albuterol (VENTOLIN HFA) 108 (90 Base) MCG/ACT inhaler, Inhale 2 puffs into the lungs every 6 (six) hours as needed for wheezing or shortness of breath., Disp: 6.7 g, Rfl: 2   amLODipine-benazepril (LOTREL) 10-40 MG capsule, Take 1 capsule by mouth daily., Disp: 90 capsule, Rfl: 2   cetirizine (ZYRTEC) 10 MG tablet, TAKE 1 TABLET BY MOUTH EVERY DAY, Disp: 90 tablet, Rfl: 3   cloNIDine (CATAPRES) 0.2 MG tablet, Take 1 tablet (0.2 mg total) by mouth 2 (two) times daily. (Patient not taking: Reported on 03/20/2023), Disp: 60 tablet, Rfl: 12   estradiol (ESTRACE) 0.1 MG/GM vaginal cream, Place 1 Applicatorful vaginally at bedtime. (Patient not taking: Reported on 03/20/2023), Disp: 42.5 g, Rfl: 12   fluconazole (DIFLUCAN) 150 MG tablet, Take 1 tablet (150 mg total) by mouth every 3 (three) days. Take one tablet. Repeat dose in 3 days if symptoms have not improved. (Patient not taking: Reported on 03/20/2023), Disp: 2 tablet, Rfl: 0   fluticasone (FLONASE) 50 MCG/ACT nasal spray, Spray two sprays into each nostril daily, Disp: 48 mL, Rfl: 3   hydrochlorothiazide (MICROZIDE) 12.5  MG capsule, Take 1 capsule (12.5 mg total) by mouth daily. (Patient not taking: Reported on 03/20/2023), Disp: 180 capsule, Rfl: 1   ibuprofen (ADVIL) 800 MG tablet, Take 1 tablet (800 mg total) by mouth every 6 (six) hours as needed., Disp: 60 tablet, Rfl: 1   metFORMIN (GLUCOPHAGE-XR) 750 MG 24 hr tablet, Take 1 tablet (750 mg total) by mouth daily with breakfast., Disp: 90 tablet, Rfl: 1   metroNIDAZOLE (FLAGYL) 500 MG tablet, Take 1 tablet (500 mg total) by mouth 2 (two) times daily., Disp: 14 tablet, Rfl: 0   oxyCODONE-acetaminophen (PERCOCET) 5-325 MG tablet, Take 1 tablet by mouth every 4 (four) hours as needed for severe pain., Disp: 30 tablet, Rfl: 0   phenazopyridine (PYRIDIUM) 100 MG tablet, Take 1 tablet (100 mg total) by mouth 3 (three) times daily as needed for pain. (Patient not taking: Reported on 03/07/2023), Disp: 10 tablet, Rfl: 0  Social History   Tobacco Use  Smoking Status Former  Smokeless Tobacco Never  Tobacco Comments   QUIT IN 2004    Allergies  Allergen Reactions   Latex Itching   Objective:  There were no vitals filed for this visit. There is no height or weight on file to calculate BMI. Constitutional Well developed. Well nourished.  Vascular Foot warm and well perfused. Capillary refill normal to all digits.   Neurologic Normal speech. Oriented to person, place, and time. Epicritic sensation to light touch  grossly present bilaterally.  Dermatologic Skin healing well without signs of infection. Skin edges well coapted without signs of infection.  Orthopedic: Tenderness to palpation noted about the surgical site.   Radiographs: None Assessment:   1. Bony exostosis   2. Status post foot surgery    Plan:  Patient was evaluated and treated and all questions answered.  S/p foot surgery left -Progressing as expected post-operatively. -XR: See above -WB Status: Weightbearing as tolerated in surgical shoe -Sutures: Intact.  No clinical signs of  dehiscence.  No complication noted. -Medications: None -Foot redressed.  No follow-ups on file.

## 2023-05-16 ENCOUNTER — Ambulatory Visit (INDEPENDENT_AMBULATORY_CARE_PROVIDER_SITE_OTHER): Payer: 59 | Admitting: Family Medicine

## 2023-05-16 ENCOUNTER — Encounter: Payer: Self-pay | Admitting: Family Medicine

## 2023-05-16 VITALS — BP 143/75 | HR 80 | Ht 68.0 in | Wt 263.8 lb

## 2023-05-16 DIAGNOSIS — R7303 Prediabetes: Secondary | ICD-10-CM | POA: Diagnosis not present

## 2023-05-16 DIAGNOSIS — L918 Other hypertrophic disorders of the skin: Secondary | ICD-10-CM | POA: Diagnosis not present

## 2023-05-16 NOTE — Assessment & Plan Note (Addendum)
Procedure: Skin tag removal Informed consent:  Discussed risks (permanent scarring, infection, pain, bleeding, bruising, redness, and recurrence of the lesion) and benefits of the procedure, as well as the alternatives.  She is aware that skin tags are benign lesions, and their removal is often not considered medically necessary.  Informed consent was obtained. Anesthesia: lidocaine 2%  The area was prepared and draped in a standard fashion. Snip removal was performed.   The patient tolerated procedure well. The patient was instructed on post-op care.   Number of lesions removed:  12

## 2023-05-16 NOTE — Assessment & Plan Note (Signed)
A1c was in pre-diabetes range at 6.2 as of May 2024 Counseled patient on dietary management limiting sugary beverages, rice, pasta, bread and desserts and regular physical activity.  Unable to tolerate metformin 750mg , discontinued medication and advised patient to focus on lifestyle modifications

## 2023-05-29 ENCOUNTER — Ambulatory Visit (INDEPENDENT_AMBULATORY_CARE_PROVIDER_SITE_OTHER): Payer: 59 | Admitting: Podiatry

## 2023-05-29 DIAGNOSIS — Z9889 Other specified postprocedural states: Secondary | ICD-10-CM

## 2023-05-29 DIAGNOSIS — M898X9 Other specified disorders of bone, unspecified site: Secondary | ICD-10-CM

## 2023-05-29 NOTE — Progress Notes (Signed)
Subjective:  Patient ID: Rhonda Williamson, female    DOB: September 05, 1966,  MRN: [id number]  Chief Complaint  Patient presents with   Routine Post Op    POV # 2 DOS 05/07/23 --- LEFT MIDFOOT EXOSTECTOMY    DOS: 05/07/2023 Procedure: Left midfoot exostectomy  57 y.o. female returns for post-op check.  Patient states she is doing well.  Denies any other acute complaints.  Denies any nausea fever chills vomiting bandages clean dry and intact  Review of Systems: Negative except as noted in the HPI. Denies N/V/F/Ch.  Past Medical History:  Diagnosis Date   Hypertension    Sleep apnea     Current Outpatient Medications:    acetaminophen (TYLENOL) 325 MG tablet, Take 2 tablets (650 mg total) by mouth every 6 (six) hours as needed for mild pain or headache (fever >/= 101)., Disp:  , Rfl:    albuterol (VENTOLIN HFA) 108 (90 Base) MCG/ACT inhaler, Inhale 2 puffs into the lungs every 6 (six) hours as needed for wheezing or shortness of breath., Disp: 6.7 g, Rfl: 2   amLODipine-benazepril (LOTREL) 10-40 MG capsule, Take 1 capsule by mouth daily., Disp: 90 capsule, Rfl: 2   cetirizine (ZYRTEC) 10 MG tablet, TAKE 1 TABLET BY MOUTH EVERY DAY, Disp: 90 tablet, Rfl: 3   cloNIDine (CATAPRES) 0.2 MG tablet, Take 1 tablet (0.2 mg total) by mouth 2 (two) times daily., Disp: 60 tablet, Rfl: 12   estradiol (ESTRACE) 0.1 MG/GM vaginal cream, Place 1 Applicatorful vaginally at bedtime., Disp: 42.5 g, Rfl: 12   fluconazole (DIFLUCAN) 150 MG tablet, Take 1 tablet (150 mg total) by mouth every 3 (three) days. Take one tablet. Repeat dose in 3 days if symptoms have not improved., Disp: 2 tablet, Rfl: 0   fluticasone (FLONASE) 50 MCG/ACT nasal spray, Spray two sprays into each nostril daily, Disp: 48 mL, Rfl: 3   hydrochlorothiazide (MICROZIDE) 12.5 MG capsule, Take 1 capsule (12.5 mg total) by mouth daily., Disp: 180 capsule, Rfl: 1   ibuprofen (ADVIL) 800 MG tablet, Take 1 tablet (800 mg total) by mouth every 6 (six)  hours as needed., Disp: 60 tablet, Rfl: 1   metroNIDAZOLE (FLAGYL) 500 MG tablet, Take 1 tablet (500 mg total) by mouth 2 (two) times daily., Disp: 14 tablet, Rfl: 0   oxyCODONE-acetaminophen (PERCOCET) 5-325 MG tablet, Take 1 tablet by mouth every 4 (four) hours as needed for severe pain., Disp: 30 tablet, Rfl: 0   phenazopyridine (PYRIDIUM) 100 MG tablet, Take 1 tablet (100 mg total) by mouth 3 (three) times daily as needed for pain., Disp: 10 tablet, Rfl: 0  Social History   Tobacco Use  Smoking Status Former  Smokeless Tobacco Never  Tobacco Comments   QUIT IN 2004    Allergies  Allergen Reactions   Latex Itching   Objective:  There were no vitals filed for this visit. There is no height or weight on file to calculate BMI. Constitutional Well developed. Well nourished.  Vascular Foot warm and well perfused. Capillary refill normal to all digits.   Neurologic Normal speech. Oriented to person, place, and time. Epicritic sensation to light touch grossly present bilaterally.  Dermatologic Skin completely epithelialized.  No signs of Deis is noted no complication noted.  Orthopedic: No tenderness to palpation noted about the surgical site.   Radiographs: None Assessment:   1. Bony exostosis   2. Status post foot surgery     Plan:  Patient was evaluated and treated and all questions answered.  S/p foot surgery left -Clinically healed officially discharged from my care if any foot and ankle issues on future she will come back and see me.  I discussed shoe gear modification she states understanding.  No follow-ups on file.

## 2023-05-30 ENCOUNTER — Other Ambulatory Visit: Payer: Self-pay | Admitting: Obstetrics

## 2023-06-15 ENCOUNTER — Encounter: Payer: Self-pay | Admitting: Pharmacist

## 2023-06-18 ENCOUNTER — Telehealth: Payer: Self-pay | Admitting: Family Medicine

## 2023-06-20 ENCOUNTER — Ambulatory Visit: Payer: 59 | Admitting: Family Medicine

## 2023-07-30 ENCOUNTER — Ambulatory Visit: Payer: 59 | Admitting: Family Medicine

## 2023-07-31 ENCOUNTER — Encounter: Payer: Self-pay | Admitting: Family Medicine

## 2023-07-31 ENCOUNTER — Ambulatory Visit (INDEPENDENT_AMBULATORY_CARE_PROVIDER_SITE_OTHER): Payer: 59 | Admitting: Family Medicine

## 2023-07-31 DIAGNOSIS — Z23 Encounter for immunization: Secondary | ICD-10-CM

## 2023-07-31 DIAGNOSIS — L918 Other hypertrophic disorders of the skin: Secondary | ICD-10-CM

## 2023-07-31 NOTE — Progress Notes (Signed)
Established patient visit   Patient: Rhonda Williamson   DOB: December 31, 1965   57 y.o. Female  MRN: [id number] Visit Date: 07/31/2023  Today's healthcare provider: [name], MD   Chief Complaint  Patient presents with   Immunizations    2nd shingles vaccine. Patient administered her first injection on 03/20/23.   Subjective     HPI     Immunizations    Additional comments: 2nd shingles vaccine. Patient administered her first injection on 03/20/23.      Last edited by Acey Lav, CMA on 07/31/2023  3:08 PM.        Patient here for Shingrix dose 1 vaccination only.  I did not examine the patient.  I did review her medical history, medications, and allergies and vaccine consent form.  CMA gave vaccination. Patient tolerated well.  [name], MD,  [facility] 07/31/2023 11:40 PM

## 2023-10-09 ENCOUNTER — Other Ambulatory Visit: Payer: Self-pay | Admitting: Family Medicine

## 2023-10-09 DIAGNOSIS — I10 Essential (primary) hypertension: Secondary | ICD-10-CM

## 2023-11-08 ENCOUNTER — Ambulatory Visit: Payer: Self-pay | Admitting: *Deleted

## 2023-11-08 NOTE — Telephone Encounter (Signed)
 Summary: Yeast infection   The patient called in stating she believes she has a yeast infection. She states she has had Fluconazole  before and it helped. Please assist patient further         Reason for Disposition  [1] Symptoms of a yeast infection (i.e., itchy, white discharge, not bad smelling) AND [2] not improved > 3 days following Care Advice  Answer Assessment - Initial Assessment Questions 1. DISCHARGE: Describe the discharge. (e.g., white, yellow, green, gray, foamy, cottage cheese-like)     Discharge, irritation on skin 2. ODOR: Is there a bad odor?     no 3. ONSET: When did the discharge begin?     Tuesday night 4. RASH: Is there a rash in the genital area? If Yes, ask: Describe it. (e.g., redness, blisters, sores, bumps)     no 5. ABDOMEN PAIN: Are you having any abdomen pain? If Yes, ask: What does it feel like?  (e.g., crampy, dull, intermittent, constant)      no 6. ABDOMEN PAIN SEVERITY: If present, ask: How bad is it? (e.g., Scale 1-10; mild, moderate, or severe)   - MILD (1-3): Doesn't interfere with normal activities, abdomen soft and not tender to touch.    - MODERATE (4-7): Interferes with normal activities or awakens from sleep, abdomen tender to touch.    - SEVERE (8-10): Excruciating pain, doubled over, unable to do any normal activities. (R/O peritonitis)      no 7. CAUSE: What do you think is causing the discharge? Have you had the same problem before? What happened then?     Yeast infection 8. OTHER SYMPTOMS: Do you have any other symptoms? (e.g., fever, itching, vaginal bleeding, pain with urination, injury to genital area, vaginal foreign body)     Discharge, itching  Protocols used: Vaginal Discharge-A-AH

## 2023-11-08 NOTE — Telephone Encounter (Signed)
  Chief Complaint: medication request- yeast treatment  Symptoms: vaginal discharge, itching-irritation Frequency: started Tuesday night Pertinent Negatives: Patient denies blisters, sores, bumps  Disposition: [] ED /[] Urgent Care (no appt availability in office) / [] Appointment(In office/virtual)/ []  [place] [facility]/ [] Home Care/ [x] Refused Recommended Disposition /[] [place] [facility]/ []  Follow-up with PCP Additional Notes: Patient advised she will need appointment- but she states it is hard for her to get off work. Patient would like for provider to send in fluconazole - patient advised she may try OTC yeast treatment- but she really would like the oral treatment if possible.  Patient advised I would send her request.

## 2023-11-08 NOTE — Telephone Encounter (Signed)
 Please schedule pt for virtual visit to discuss symptoms and prescription

## 2023-11-13 NOTE — Telephone Encounter (Signed)
Pt went to urgent care.  She did schedule an appt for 11/27/2023 at 240pm to discuss why she keeps getting yeast infections.

## 2023-11-27 ENCOUNTER — Encounter: Payer: Self-pay | Admitting: Family Medicine

## 2023-11-27 ENCOUNTER — Ambulatory Visit (INDEPENDENT_AMBULATORY_CARE_PROVIDER_SITE_OTHER): Payer: Self-pay | Admitting: Family Medicine

## 2023-11-27 VITALS — BP 137/75 | HR 94 | Ht 68.0 in | Wt 266.0 lb

## 2023-11-27 DIAGNOSIS — I10 Essential (primary) hypertension: Secondary | ICD-10-CM

## 2023-11-27 DIAGNOSIS — B3731 Acute candidiasis of vulva and vagina: Secondary | ICD-10-CM | POA: Diagnosis not present

## 2023-11-27 DIAGNOSIS — L308 Other specified dermatitis: Secondary | ICD-10-CM | POA: Diagnosis not present

## 2023-11-27 MED ORDER — FLUCONAZOLE 150 MG PO TABS
ORAL_TABLET | ORAL | 0 refills | Status: AC
Start: 2023-11-27 — End: ?

## 2023-11-27 MED ORDER — TRIAMCINOLONE ACETONIDE 0.1 % EX OINT
1.0000 | TOPICAL_OINTMENT | Freq: Two times a day (BID) | CUTANEOUS | 2 refills | Status: AC
Start: 2023-11-27 — End: 2023-12-11

## 2023-11-27 NOTE — Assessment & Plan Note (Signed)
 Chronic  Well controlled  Continue amlodipine 10mg -benazepril 40mg  daily  Continue hydrochlorothiazide 12.5mg  daily

## 2023-11-27 NOTE — Progress Notes (Signed)
 Established patient visit   Patient: Rhonda Williamson   DOB: 04-21-1966   58 y.o. Female  MRN: [id number] Visit Date: 11/27/2023  Today's healthcare provider: [name], MD   Chief Complaint  Patient presents with   Vaginitis    She had a yeast infection about 2 weeks ago, she is better now, just want to make sure everything is ok.    Subjective     HPI     Vaginitis    Additional comments: She had a yeast infection about 2 weeks ago, she is better now, just want to make sure everything is ok.       Last edited by Thedora Hinders, CMA on 11/27/2023  3:12 PM.       Discussed the use of AI scribe software for clinical note transcription with the patient, who gave verbal consent to proceed.  History of Present Illness   Rhonda Williamson is a 58 year old female who presents with concerns for recurrent yeast infections and potential respiratory infection.  She experiences recurrent yeast infections, primarily presenting as external itching. Two weeks ago, she was prescribed fluconazole and an antibiotic for bacterial vaginosis. She notes a clear discharge with a fishy odor, which she associates with bacterial vaginosis. She has switched to wearing only white cotton underwear to prevent irritation.  She is concerned about a potential respiratory infection, noting that her ears have been bothering her. She has visited urgent care multiple times for ear issues, which she believes may be related to her braces. Her ears are itchy and wet in the mornings, and her nose is congested. She is currently taking Zyrtec for allergy symptoms and uses Flonase. Her ear canals feel irritated and itchy, and she has eczema, which she believes may be affecting her ears.  She discusses her partner's health, mentioning that he is uncircumcised and has experienced redness on the head of his penis, which improved after taking fluconazole. He is diabetic and wears special underwear due to  incontinence issues. She emphasizes the importance of cleanliness due to his uncircumcised status.         Past Medical History:  Diagnosis Date   Hypertension    Sleep apnea     Medications: Outpatient Medications Prior to Visit  Medication Sig   acetaminophen (TYLENOL) 325 MG tablet Take 2 tablets (650 mg total) by mouth every 6 (six) hours as needed for mild pain or headache (fever >/= 101).   albuterol (VENTOLIN HFA) 108 (90 Base) MCG/ACT inhaler Inhale 2 puffs into the lungs every 6 (six) hours as needed for wheezing or shortness of breath.   amLODipine-benazepril (LOTREL) 10-40 MG capsule TAKE 1 CAPSULE BY MOUTH EVERY DAY   cetirizine (ZYRTEC) 10 MG tablet TAKE 1 TABLET BY MOUTH EVERY DAY   fluticasone (FLONASE) 50 MCG/ACT nasal spray Spray two sprays into each nostril daily   hydrochlorothiazide (MICROZIDE) 12.5 MG capsule Take 1 capsule (12.5 mg total) by mouth daily.   No facility-administered medications prior to visit.    Review of Systems      Objective    BP 137/75   Pulse 94   Ht 5\' 8"  (1.727 m)   Wt 266 lb (120.7 kg)   LMP 07/19/1995 (Approximate)   SpO2 99%   BMI 40.45 kg/m  BP Readings from Last 3 Encounters:  11/27/23 137/75  05/16/23 (!) 143/75  03/20/23 136/78   Wt Readings from Last 3 Encounters:  11/27/23 266 lb (120.7  kg)  05/16/23 263 lb 12.8 oz (119.7 kg)  03/20/23 262 lb (118.8 kg)        Physical Exam  Physical Exam   HEENT: Ear canal irritated. No otitis media. No bleeding in ear canal, sensitive to otologic exam        No results found for any visits on 11/27/23.  Assessment & Plan     Problem List Items Addressed This Visit       Cardiovascular and Mediastinum   Essential (primary) hypertension   Chronic  Well controlled  Continue amlodipine 10mg -benazepril 40mg  daily  Continue hydrochlorothiazide 12.5mg  daily       Other Visit Diagnoses       Other eczema    -  Primary   Relevant Medications   triamcinolone  ointment (KENALOG) 0.1 %     Vaginal candidiasis       Relevant Medications   fluconazole (DIFLUCAN) 150 MG tablet          Recurrent Yeast Infections   Recurrent external yeast infections characterized by itching. Previously treated with fluconazole and antibiotics for bacterial vaginosis. Discussed potential triggers including pH changes and environmental factors. Advised on avoiding irritants and maintaining proper hygiene. Discussed the potential benefit of using condoms to reduce environmental interactions. Patient prefers to call at the first sign of symptoms.   Chronic  - Avoid using scented products and beads in laundry   - Wear 100% cotton underwear   - Diflucan 150mg  two tabs with instructions per prescription sent to pharmacy   Bacterial Vaginosis   Recent episode characterized by clear discharge with a fishy odor. Treated with antibiotics. Discussed that pH changes can trigger bacterial vaginosis and it is not sexually transmitted. Discussed the potential benefit of using condoms to reduce environmental interactions.   - Monitor for symptoms and call if she recurs   - Consider using condoms to reduce environmental interactions    Eczema   Eczema affecting the ear canals, causing itching and irritation. No signs of otitis media. Discussed the use of steroid cream to reduce inflammation. Advised on the use of CeraVe or Vaseline lotion for moisturizing and Eucerin facial moisturizer for dry skin.   Chronic, moderate  - Prescribe triamcinolone cream for ear canals, apply twice daily for two weeks   - Use a Q-tip to apply a small amount of cream   - Use CeraVe or Vaseline lotion for moisturizing   - Use Eucerin facial moisturizer for dry skin    Allergic Rhinitis   Symptoms of nasal congestion and itching, likely due to allergies. Currently taking Zyrtec. Discussed the addition of Flonase for additional symptom control.   Chronic  - Continue Zyrtec for allergy management   -  Add Flonase for additional symptom control    Follow-up   - Follow-up if symptoms of yeast infection or bacterial vaginosis recur   - Partner to have an appointment on Tuesday for related concerns.         Return in about 4 months (around 03/26/2024) for CPE.         [name], MD  [facility] [phone] (phone) [phone] (fax)  [facility]

## 2023-11-27 NOTE — Patient Instructions (Addendum)
 Eucerin Facial Moisturizer Day and Night bottles   VISIT SUMMARY:  Rhonda Williamson, a 58 year old female, visited today due to concerns about recurrent yeast infections and a potential respiratory infection. She has been experiencing external itching from yeast infections and was recently treated for bacterial vaginosis. She also has symptoms of nasal congestion and ear irritation, which she believes may be related to her braces and eczema. Her partner's health was also discussed, particularly his experience with redness on the head of his penis, which improved after taking fluconazole.  YOUR PLAN:  -RECURRENT YEAST INFECTIONS: Recurrent yeast infections are caused by an overgrowth of yeast, leading to itching and discomfort. You were advised to avoid irritants, maintain proper hygiene, and use 100% cotton underwear. Please call at the first sign of symptoms. Avoid using scented products and beads in laundry.  -BACTERIAL VAGINOSIS: Bacterial vaginosis is an imbalance of the natural bacteria in the vagina, causing a clear discharge with a fishy odor. It is not sexually transmitted. You were treated with antibiotics and advised to monitor for symptoms. Consider using condoms to reduce environmental interactions.  -ECZEMA: Eczema is a condition that makes your skin red and itchy. It is affecting your ear canals, causing irritation. You were prescribed triamcinolone cream to apply twice daily for two weeks using a Q-tip. Use CeraVe or Vaseline lotion for moisturizing and Eucerin facial moisturizer for dry skin.  -ALLERGIC RHINITIS: Allergic rhinitis is an allergic reaction that causes nasal congestion and itching. You are currently taking Zyrtec and were advised to add Flonase for additional symptom control.  INSTRUCTIONS:  Please follow up if symptoms of yeast infection or bacterial vaginosis recur. Your partner has an appointment on Tuesday for related concerns.

## 2024-01-05 ENCOUNTER — Other Ambulatory Visit: Payer: Self-pay | Admitting: Family Medicine

## 2024-01-05 DIAGNOSIS — I10 Essential (primary) hypertension: Secondary | ICD-10-CM

## 2024-01-07 ENCOUNTER — Telehealth: Payer: Self-pay | Admitting: Family Medicine

## 2024-01-07 NOTE — Telephone Encounter (Signed)
 Requested Prescriptions  Pending Prescriptions Disp Refills   hydrochlorothiazide (MICROZIDE) 12.5 MG capsule [Pharmacy Med Name: HYDROCHLOROTHIAZIDE 12.5 MG CP] 90 capsule 0    Sig: TAKE 1 CAPSULE BY MOUTH EVERY DAY     Cardiovascular: Diuretics - Thiazide Failed - 01/07/2024  2:07 PM      Failed - Cr in normal range and within 180 days    Creatinine, Ser  Date Value Ref Range Status  03/20/2023 0.66 0.57 - 1.00 mg/dL Final         Failed - K in normal range and within 180 days    Potassium  Date Value Ref Range Status  03/20/2023 3.9 3.5 - 5.2 mmol/L Final         Failed - Na in normal range and within 180 days    Sodium  Date Value Ref Range Status  03/20/2023 142 134 - 144 mmol/L Final         Passed - Last BP in normal range    BP Readings from Last 1 Encounters:  11/27/23 137/75         Passed - Valid encounter within last 6 months    Recent Outpatient Visits           1 month ago Other eczema   [facility] Simmons-Robinson, Tawanna Cooler, MD       Future Appointments             In 2 months Simmons-Robinson, Tawanna Cooler, MD [facility]

## 2024-01-07 NOTE — Telephone Encounter (Unsigned)
 Copied from CRM [id number]. Topic: Clinical - Medication Refill >> Jan 07, 2024 10:22 AM Everette C wrote: Most Recent Primary Care Visit:  Provider: [name]  Department: [facility]  Visit Type: OFFICE VISIT  Date: 11/27/2023  Medication: hydrochlorothiazide (MICROZIDE) 12.5 MG capsule [[id number]]  Has the patient contacted their pharmacy? Yes (Agent: If no, request that the patient contact the pharmacy for the refill. If patient does not wish to contact the pharmacy document the reason why and proceed with request.) (Agent: If yes, when and what did the pharmacy advise?)  Is this the correct pharmacy for this prescription? Yes If no, delete pharmacy and type the correct one.  This is the patient's preferred pharmacy:  [facility] [address], [place] - [address] [place] [address] Phone: [phone] Fax: [phone]   Has the prescription been filled recently? Yes  Is the patient out of the medication? No  Has the patient been seen for an appointment in the last year OR does the patient have an upcoming appointment? Yes  Can we respond through MyChart? No  Agent: Please be advised that Rx refills may take up to 3 business days. We ask that you follow-up with your pharmacy.

## 2024-03-17 ENCOUNTER — Other Ambulatory Visit: Payer: Self-pay | Admitting: Family Medicine

## 2024-03-17 DIAGNOSIS — J4520 Mild intermittent asthma, uncomplicated: Secondary | ICD-10-CM

## 2024-03-25 ENCOUNTER — Encounter: Payer: Self-pay | Admitting: Family Medicine

## 2024-03-25 ENCOUNTER — Ambulatory Visit (INDEPENDENT_AMBULATORY_CARE_PROVIDER_SITE_OTHER): Payer: Self-pay | Admitting: Family Medicine

## 2024-03-25 VITALS — BP 137/81 | HR 87 | Ht 68.0 in | Wt 268.0 lb

## 2024-03-25 DIAGNOSIS — Z130 Encounter for screening for diseases of the blood and blood-forming organs and certain disorders involving the immune mechanism: Secondary | ICD-10-CM

## 2024-03-25 DIAGNOSIS — Z0000 Encounter for general adult medical examination without abnormal findings: Secondary | ICD-10-CM

## 2024-03-25 DIAGNOSIS — L658 Other specified nonscarring hair loss: Secondary | ICD-10-CM | POA: Diagnosis not present

## 2024-03-25 DIAGNOSIS — I10 Essential (primary) hypertension: Secondary | ICD-10-CM

## 2024-03-25 DIAGNOSIS — R7303 Prediabetes: Secondary | ICD-10-CM

## 2024-03-25 DIAGNOSIS — Z13220 Encounter for screening for lipoid disorders: Secondary | ICD-10-CM

## 2024-03-25 MED ORDER — SPIRONOLACTONE 25 MG PO TABS
25.0000 mg | ORAL_TABLET | Freq: Every day | ORAL | 3 refills | Status: AC
Start: 2024-03-25 — End: ?

## 2024-03-25 MED ORDER — MINOXIDIL 5 % EX FOAM: 1.0000 | Freq: Every day | CUTANEOUS | 1 refills | Status: AC

## 2024-03-25 NOTE — Progress Notes (Signed)
 Complete physical exam   Patient: Rhonda Williamson   DOB: Nov 13, 1965   58 y.o. Female  MRN: [id number] Visit Date: 03/25/2024  Today's healthcare provider: Rockie Agent, MD   Chief Complaint  Patient presents with   Annual Exam   Subjective    Rhonda Williamson is a 58 y.o. female who presents today for a complete physical exam.   She reports consuming a general diet.   The patient does not participate in regular exercise at present.    She does not have additional problems to discuss today.   Discussed the use of AI scribe software for clinical note transcription with the patient, who gave verbal consent to proceed.  History of Present Illness Rhonda Williamson is a 58 year old female who presents for an annual physical exam.  She has a history of obstructive sleep apnea and experiences issues with her CPAP machine, including discomfort and high costs of replacement masks. She has tried different types of masks, including nasal ones, but finds them uncomfortable. She uses a clean washcloth to help with air leakage and often has a sore mouth in the morning.  She has central hypertension and is currently on amlodipine  and benazepril . She is concerned about potential side effects of these medications, such as facial and throat swelling, and hair loss. She has been on blood pressure medications since before 2012.  She has prediabetes and recalls a previous incident where her blood sugar was elevated after eating a hot dog. She is fasting today for lab work, including A1c, CBC, CMP, and a lipid panel.  She is obese with a BMI of 40 and struggles with weight management. Despite having a treadmill and other gym equipment at home, she finds it challenging to maintain a consistent exercise routine due to her busy schedule. She recalls feeling better when she exercised regularly, including improved bowel movements.  She reports sinus issues that exacerbate her blood pressure  and mentions that her sinuses are currently 'cutting up,' which she attributes to environmental factors like grass and cigarette smoke. She is not currently taking any medication for allergies.  She is concerned about hair loss, which she has noticed since around 2012-2014. She plans to consult a dermatologist and mentions a family history of hair loss, as her niece experienced similar issues.     Past Medical History:  Diagnosis Date   Hypertension    Sleep apnea    Past Surgical History:  Procedure Laterality Date   ABDOMINAL HYSTERECTOMY  1998   BUNIONECTOMY  2005   CESAREAN SECTION  1993   COLONOSCOPY WITH PROPOFOL  N/A 03/25/2021   Procedure: COLONOSCOPY WITH PROPOFOL ;  Surgeon: Therisa Bi, MD;  Location: [facility] ENDOSCOPY;  Service: Gastroenterology;  Laterality: N/A;   COLONOSCOPY WITH PROPOFOL  N/A 03/29/2021   Procedure: COLONOSCOPY WITH PROPOFOL ;  Surgeon: Therisa Bi, MD;  Location: [facility] ENDOSCOPY;  Service: Gastroenterology;  Laterality: N/A;   TUBAL LIGATION  1993   Social History   Socioeconomic History   Marital status: Married    Spouse name: Not on file   Number of children: Not on file   Years of education: Not on file   Highest education level: Not on file  Occupational History   Not on file  Tobacco Use   Smoking status: Former   Smokeless tobacco: Never   Tobacco comments:    QUIT IN 2004  Vaping Use   Vaping status: Never Used  Substance and Sexual Activity  Alcohol use: No    Alcohol/week: 0.0 standard drinks of alcohol   Drug use: No   Sexual activity: Yes  Other Topics Concern   Not on file  Social History Narrative   Not on file   Social Drivers of Health   Financial Resource Strain: Low Risk  (03/25/2024)   Overall Financial Resource Strain (CARDIA)    Difficulty of Paying Living Expenses: Not hard at all  Food Insecurity: No Food Insecurity (03/25/2024)   Hunger Vital Sign    Worried About Running Out of Food in the Last Year: Never true     Ran Out of Food in the Last Year: Never true  Transportation Needs: No Transportation Needs (03/25/2024)   PRAPARE - Administrator, Civil Service (Medical): No    Lack of Transportation (Non-Medical): No  Physical Activity: Not on file  Stress: No Stress Concern Present (03/25/2024)   [employer] of Occupational Health - Occupational Stress Questionnaire    Feeling of Stress: Not at all  Social Connections: Not on file  Intimate Partner Violence: Not At Risk (03/25/2024)   Humiliation, Afraid, Rape, and Kick questionnaire    Fear of Current or Ex-Partner: No    Emotionally Abused: No    Physically Abused: No    Sexually Abused: No   Family Status  Relation Name Status   Mother  Deceased at age 33   Father  Deceased at age 82   Sister 1 Deceased   Brother 3 Alive   Sister 2 Deceased at age 8       died of cancer on 05/26/24   Brother 1 Deceased       ACCIDENTAL DEATH   Brother 2 Deceased   Sister 3 Alive       BREAST CYST   Sister 4 Alive   Neg Hx  (Not Specified)  No partnership data on file   Family History  Problem Relation Age of Onset   Ovarian cancer Mother    Lung cancer Father    Lung cancer Sister    Bone cancer Sister    CVA Sister    Emphysema Brother    Hypertension Sister    Breast cancer Neg Hx    Allergies  Allergen Reactions   Latex Itching     Medications: Outpatient Medications Prior to Visit  Medication Sig Note   acetaminophen  (TYLENOL ) 325 MG tablet Take 2 tablets (650 mg total) by mouth every 6 (six) hours as needed for mild pain or headache (fever >/= 101).    albuterol  (VENTOLIN  HFA) 108 (90 Base) MCG/ACT inhaler TAKE 2 PUFFS BY MOUTH EVERY 6 HOURS AS NEEDED FOR WHEEZE OR SHORTNESS OF BREATH    amLODipine -benazepril  (LOTREL) 10-40 MG capsule TAKE 1 CAPSULE BY MOUTH EVERY DAY    cetirizine  (ZYRTEC ) 10 MG tablet TAKE 1 TABLET BY MOUTH EVERY DAY    fluconazole  (DIFLUCAN ) 150 MG tablet Take one 150mg  tablet by mouth  at onset of symptoms and repeat after 72 hours if symptoms continue    fluticasone  (FLONASE ) 50 MCG/ACT nasal spray Spray two sprays into each nostril daily    [DISCONTINUED] hydrochlorothiazide  (MICROZIDE ) 12.5 MG capsule TAKE 1 CAPSULE BY MOUTH EVERY DAY 03/25/2024: changed due to hair loss   No facility-administered medications prior to visit.    Review of Systems  Last CBC Lab Results  Component Value Date   WBC 8.2 03/20/2023   HGB 13.1 03/20/2023   HCT 38.9 03/20/2023   MCV  86 03/20/2023   MCH 29.0 03/20/2023   RDW 12.5 03/20/2023   PLT 279 03/20/2023   Last metabolic panel Lab Results  Component Value Date   GLUCOSE 93 03/20/2023   NA 142 03/20/2023   K 3.9 03/20/2023   CL 103 03/20/2023   CO2 24 03/20/2023   BUN 13 03/20/2023   CREATININE 0.66 03/20/2023   EGFR 102 03/20/2023   CALCIUM 10.0 03/20/2023   PROT 7.4 03/20/2023   ALBUMIN 4.4 03/20/2023   LABGLOB 3.0 03/20/2023   AGRATIO 1.4 11/07/2022   BILITOT 0.4 03/20/2023   ALKPHOS 72 03/20/2023   AST 37 03/20/2023   ALT 52 (H) 03/20/2023   ANIONGAP 10 10/18/2019   Last lipids Lab Results  Component Value Date   CHOL 126 03/20/2023   HDL 42 03/20/2023   LDLCALC 65 03/20/2023   TRIG 100 03/20/2023   CHOLHDL 3.0 03/20/2023   Last hemoglobin A1c Lab Results  Component Value Date   HGBA1C 6.2 (H) 03/01/2023   Last thyroid functions Lab Results  Component Value Date   TSH 1.650 03/20/2023   Last vitamin D  No results found for: 25OHVITD2, 25OHVITD3, VD25OH Last vitamin B12 and Folate No results found for: VITAMINB12, FOLATE   Objective    BP 137/81   Pulse 87   Ht 5' 8 (1.727 m)   Wt 268 lb (121.6 kg)   LMP 07/19/1995 (Approximate)   SpO2 98%   BMI 40.75 kg/m  BP Readings from Last 3 Encounters:  03/25/24 137/81  11/27/23 137/75  05/16/23 (!) 143/75   Wt Readings from Last 3 Encounters:  03/25/24 268 lb (121.6 kg)  11/27/23 266 lb (120.7 kg)  05/16/23 263 lb 12.8 oz (119.7  kg)        Physical Exam Vitals reviewed.  Constitutional:      General: She is not in acute distress.    Appearance: Normal appearance. She is not ill-appearing, toxic-appearing or diaphoretic.  HENT:     Head: Normocephalic and atraumatic.     Right Ear: Tympanic membrane and external ear normal. There is no impacted cerumen.     Left Ear: Tympanic membrane and external ear normal. There is no impacted cerumen.     Nose: Nose normal.     Mouth/Throat:     Pharynx: Oropharynx is clear.   Eyes:     General: No scleral icterus.    Extraocular Movements: Extraocular movements intact.     Conjunctiva/sclera: Conjunctivae normal.     Pupils: Pupils are equal, round, and reactive to light.    Cardiovascular:     Rate and Rhythm: Normal rate and regular rhythm.     Pulses: Normal pulses.     Heart sounds: Normal heart sounds. No murmur heard.    No friction rub. No gallop.  Pulmonary:     Effort: Pulmonary effort is normal. No respiratory distress.     Breath sounds: Normal breath sounds. No wheezing, rhonchi or rales.  Abdominal:     General: Bowel sounds are normal. There is no distension.     Palpations: Abdomen is soft. There is no mass.     Tenderness: There is no abdominal tenderness. There is no guarding.   Musculoskeletal:        General: No deformity.     Cervical back: Normal range of motion and neck supple.     Right lower leg: No edema.     Left lower leg: No edema.  Lymphadenopathy:     Cervical:  No cervical adenopathy.   Skin:    General: Skin is warm.     Capillary Refill: Capillary refill takes less than 2 seconds.     Findings: No erythema or rash.     Comments: Central scalp hair thinning    Neurological:     General: No focal deficit present.     Mental Status: She is alert and oriented to person, place, and time.     Cranial Nerves: Cranial nerves 2-12 are intact. No cranial nerve deficit or facial asymmetry.     Motor: Motor function is intact.  No weakness.     Gait: Gait normal.   Psychiatric:        Mood and Affect: Mood normal.        Behavior: Behavior normal.       Last depression screening scores    11/27/2023    3:16 PM 03/20/2023    1:04 PM 03/01/2023    2:35 PM  PHQ 2/9 Scores  PHQ - 2 Score 0 0 0  PHQ- 9 Score 0 0 1    Last fall risk screening    03/20/2023    1:04 PM  Fall Risk   Falls in the past year? 0  Number falls in past yr: 0  Injury with Fall? 0  Risk for fall due to : No Fall Risks  Follow up Falls evaluation completed    Last Audit-C alcohol use screening    03/25/2024    2:57 PM  Alcohol Use Disorder Test (AUDIT)  1. How often do you have a drink containing alcohol? 0  2. How many drinks containing alcohol do you have on a typical day when you are drinking? 0  3. How often do you have six or more drinks on one occasion? 0  AUDIT-C Score 0   A score of 3 or more in women, and 4 or more in men indicates increased risk for alcohol abuse, EXCEPT if all of the points are from question 1   No results found for any visits on 03/25/24.  Assessment & Plan    Routine Health Maintenance and Physical Exam  Immunization History  Administered Date(s) Administered   Influenza Split 11/08/2012   Influenza,inj,Quad PF,6+ Mos 07/21/2014, 06/28/2018   Pneumococcal Polysaccharide-23 11/08/2012   Tdap 11/10/2013   Zoster Recombinant(Shingrix ) 03/20/2023, 07/31/2023    Health Maintenance  Topic Date Due   COVID-19 Vaccine (1) 04/10/2024 (Originally 01/17/1971)   Hepatitis B Vaccines (1 of 3 - 19+ 3-dose series) 05/02/2024 (Originally 01/18/1984)   DTaP/Tdap/Td (2 - Td or Tdap) 03/25/2025 (Originally 11/11/2023)   Pneumococcal Vaccine 61-29 Years old (2 of 2 - PCV) 03/25/2025 (Originally 11/08/2013)   INFLUENZA VACCINE  05/02/2024   MAMMOGRAM  04/24/2025   Colonoscopy  03/30/2031   Hepatitis C Screening  Completed   HIV Screening  Completed   Zoster Vaccines- Shingrix   Completed   HPV VACCINES   Aged Out   Meningococcal B Vaccine  Aged Out    Problem List Items Addressed This Visit       Cardiovascular and Mediastinum   Essential (primary) hypertension   Relevant Medications   spironolactone (ALDACTONE) 25 MG tablet   Other Relevant Orders   CMP14+EGFR     Other   Prediabetes   Relevant Orders   Hemoglobin A1c   Morbid obesity (HCC)   Relevant Orders   Hemoglobin A1c   CMP14+EGFR   Lipid panel   Annual physical exam   Other Visit Diagnoses  Female pattern hair loss    -  Primary   Relevant Medications   Minoxidil 5 % FOAM   spironolactone (ALDACTONE) 25 MG tablet   Other Relevant Orders   TSH+T4F+T3Free     Screening, anemia, deficiency, iron       Relevant Orders   CBC     Screening, lipid       Relevant Orders   Lipid panel        Assessment & Plan Obstructive Sleep Apnea (OSA) She experiences discomfort and air leakage with her CPAP machine and mask, despite trying different types, including nasal masks. She uses a clean washcloth to mitigate air leakage. She is not interested in surgical implants for OSA management due to high costs of replacement masks. - Consider referral to pulmonology or sleep medicine for further evaluation and management of OSA. - Discuss alternative CPAP mask options and potential solutions for air leakage.  Hypertension She is on amlodipine  and benazepril  for hypertension. She expresses concerns about potential side effects, including facial swelling and hair loss. Benazepril  can cause lip swelling, and hair changes can occur with various antihypertensive medications. Her blood pressure is 137/81, which is borderline. She is hesitant to switch medications due to concerns about side effects. - Continue current antihypertensive regimen with amlodipine  and benazepril . - Discuss potential side effects of antihypertensive medications with her.  Hair Loss She attributes her hair loss, ongoing since 2012-2014, to her blood  pressure medications. Considering the possibility of alopecia, further investigation is planned. She is interested in trying treatments to promote hair growth. - Order thyroid function tests to rule out thyroid-related causes of hair loss. - Prescribe topical minoxidil 5% solution for application to the scalp. - Switch hydrochlorothiazide  12.5 mg to spironolactone 25 mg.  Prediabetes She has prediabetes and reports a previous episode of elevated blood sugar after consuming a hot dog. Plans to monitor her glycemic control. - Order HbA1c test to assess glycemic control.  Obesity With a BMI of 40, she acknowledges the need for weight loss and has attempted exercise in the past but struggles with consistency. Emphasized the importance of regular physical activity and setting small, achievable goals. - Recommend 150 minutes of exercise per week. - Encourage setting small, achievable weight loss and exercise goals. - Discuss the benefits of regular physical activity on overall health.  Allergic Rhinitis She reports sinus issues and allergy symptoms, including nasal discharge and itchy ears, exacerbated by environmental factors such as grass and cigarette smoke. She is not currently taking any allergy medications. - Avoid known allergens such as cigarette smoke and grass.  General Health Maintenance She is due for pneumococcal, tetanus, hepatitis B, and COVID-19 vaccinations but declines them due to not feeling well and sinus issues. - Document her decision to decline vaccinations at this time.       Return in about 8 weeks (around 05/20/2024) for hair loss, HTN  .       Rockie Agent, MD  [facility] [phone] (phone) [phone] (fax)  [facility]

## 2024-03-25 NOTE — Patient Instructions (Addendum)
 It was a pleasure to see you today!  Thank you for choosing [facility] for your primary care.   Today you were seen for your annual physical  Please review the attached information regarding helpful preventive health topics.   To keep you healthy, please keep in mind the following health maintenance items that you are due for:   There are no preventive care reminders to display for this patient.   VISIT SUMMARY: Today, you came in for your annual physical exam. We discussed several health concerns, including your obstructive sleep apnea, hypertension, prediabetes, obesity, sinus issues, and hair loss. We also reviewed your current medications and potential side effects. You are fasting today for lab work, including A1c, CBC, CMP, and a lipid panel.  YOUR PLAN: -OBSTRUCTIVE SLEEP APNEA (OSA): Obstructive sleep apnea is a condition where your breathing repeatedly stops and starts during sleep. We discussed your discomfort with the CPAP machine and mask. I recommend considering a referral to pulmonology or sleep medicine for further evaluation and management. We will also discuss alternative CPAP mask options and potential solutions for air leakage.  -HYPERTENSION: Hypertension is high blood pressure. You are currently on amlodipine  and benazepril . We discussed your concerns about potential side effects, including facial swelling and hair loss. Your blood pressure today is 137/81, which is borderline. We will continue your current medications and monitor for any side effects.  -HAIR LOSS: Hair loss can be due to various reasons, including medication side effects. We will investigate further by ordering thyroid function tests to rule out thyroid-related causes.  -I have prescribed topical minoxidil 5% solution for your scalp and switched your hydrochlorothiazide  to spironolactone 25mg  daily  to see if it helps.  -PREDIABETES: Prediabetes means your blood sugar levels are higher  than normal but not high enough to be classified as diabetes. We will monitor your glycemic control by ordering an HbA1c test.  -OBESITY: Obesity is having an excessive amount of body fat. With a BMI of 40, it is important to work on weight loss. I recommend aiming for 150 minutes of exercise per week and setting small, achievable goals to help you stay consistent.  -ALLERGIC RHINITIS: Allergic rhinitis is an allergic reaction that causes sneezing, congestion, and a runny nose. Your symptoms are exacerbated by environmental factors like grass and cigarette smoke. I recommend avoiding known allergens to help manage your symptoms.  -GENERAL HEALTH MAINTENANCE: You are due for pneumococcal, tetanus, hepatitis B, and COVID-19 vaccinations, but you have declined them at this time due to not feeling well and sinus issues. We have documented your decision.  INSTRUCTIONS: Please follow up with the recommended lab work, including A1c, CBC, CMP, and a lipid panel. Consider scheduling an appointment with a pulmonologist or sleep medicine specialist for your obstructive sleep apnea. Continue monitoring your blood pressure and report any side effects from your medications. Apply the prescribed minoxidil solution to your scalp as directed and monitor for any changes. Aim for 150 minutes of exercise per week and set small, achievable goals for weight loss. Avoid known allergens to help manage your sinus issues. If you change your mind about the vaccinations, please let us  know.                      Contains text generated by [employer].  Contains text generated by [employer].    Best Wishes,   Dr. Lang

## 2024-03-26 LAB — CMP14+EGFR
ALT: 27 IU/L (ref 0–32)
AST: 22 IU/L (ref 0–40)
Albumin: 4.2 g/dL (ref 3.8–4.9)
Alkaline Phosphatase: 79 IU/L (ref 44–121)
BUN/Creatinine Ratio: 14 (ref 9–23)
BUN: 10 mg/dL (ref 6–24)
Bilirubin Total: 0.5 mg/dL (ref 0.0–1.2)
CO2: 23 mmol/L (ref 20–29)
Calcium: 9.3 mg/dL (ref 8.7–10.2)
Chloride: 102 mmol/L (ref 96–106)
Creatinine, Ser: 0.7 mg/dL (ref 0.57–1.00)
Globulin, Total: 2.9 g/dL (ref 1.5–4.5)
Glucose: 96 mg/dL (ref 70–99)
Potassium: 3.9 mmol/L (ref 3.5–5.2)
Sodium: 142 mmol/L (ref 134–144)
Total Protein: 7.1 g/dL (ref 6.0–8.5)
eGFR: 100 mL/min/{1.73_m2} (ref >59)

## 2024-03-26 LAB — CBC
Hematocrit: 41.1 % (ref 34.0–46.6)
Hemoglobin: 13.4 g/dL (ref 11.1–15.9)
MCH: 30 pg (ref 26.6–33.0)
MCHC: 32.6 g/dL (ref 31.5–35.7)
MCV: 92 fL (ref 79–97)
Platelets: 274 10*3/uL (ref 150–450)
RBC: 4.47 x10E6/uL (ref 3.77–5.28)
RDW: 13.6 % (ref 11.7–15.4)
WBC: 7.6 10*3/uL (ref 3.4–10.8)

## 2024-03-26 LAB — LIPID PANEL
Chol/HDL Ratio: 3 ratio (ref 0.0–4.4)
Cholesterol, Total: 125 mg/dL (ref 100–199)
HDL: 41 mg/dL (ref >39)
LDL Chol Calc (NIH): 68 mg/dL (ref 0–99)
Triglycerides: 81 mg/dL (ref 0–149)
VLDL Cholesterol Cal: 16 mg/dL (ref 5–40)

## 2024-03-26 LAB — TSH+T4F+T3FREE
Free T4: 1.21 ng/dL (ref 0.82–1.77)
T3, Free: 3.6 pg/mL (ref 2.0–4.4)
TSH: 1.25 u[IU]/mL (ref 0.450–4.500)

## 2024-03-26 LAB — HEMOGLOBIN A1C
Est. average glucose Bld gHb Est-mCnc: 123 mg/dL
Hgb A1c MFr Bld: 5.9 % — ABNORMAL HIGH (ref 4.8–5.6)

## 2024-03-27 ENCOUNTER — Telehealth: Payer: Self-pay

## 2024-03-27 NOTE — Telephone Encounter (Signed)
 Please review. Unable to give results to patient. Labs need to be review by provider.

## 2024-03-27 NOTE — Telephone Encounter (Signed)
 Copied from CRM [id number]. Topic: Clinical - Lab/Test Results >> Mar 27, 2024  2:51 PM Rhonda Williamson wrote: Pt would like to ask a question about her A1c. Pt asking if [name] can give her a call.

## 2024-03-28 ENCOUNTER — Ambulatory Visit: Payer: Self-pay | Admitting: Family Medicine

## 2024-04-02 ENCOUNTER — Other Ambulatory Visit: Payer: Self-pay | Admitting: Family Medicine

## 2024-04-02 DIAGNOSIS — I10 Essential (primary) hypertension: Secondary | ICD-10-CM

## 2024-04-18 ENCOUNTER — Other Ambulatory Visit: Payer: Self-pay | Admitting: Family Medicine

## 2024-04-18 DIAGNOSIS — Z1231 Encounter for screening mammogram for malignant neoplasm of breast: Secondary | ICD-10-CM

## 2024-05-08 ENCOUNTER — Ambulatory Visit
Admission: RE | Admit: 2024-05-08 | Discharge: 2024-05-08 | Disposition: A | Discharge status: Home / Self Care | Source: Ambulatory Visit | Attending: Family Medicine | Admitting: Family Medicine

## 2024-05-08 DIAGNOSIS — Z1231 Encounter for screening mammogram for malignant neoplasm of breast: Secondary | ICD-10-CM | POA: Diagnosis present

## 2024-05-20 ENCOUNTER — Encounter: Payer: Self-pay | Admitting: Family Medicine

## 2024-05-20 ENCOUNTER — Other Ambulatory Visit: Payer: Self-pay | Admitting: Family Medicine

## 2024-05-20 ENCOUNTER — Ambulatory Visit (INDEPENDENT_AMBULATORY_CARE_PROVIDER_SITE_OTHER): Admitting: Family Medicine

## 2024-05-20 VITALS — BP 134/85 | HR 84 | Resp 16 | Ht 68.0 in | Wt 266.5 lb

## 2024-05-20 DIAGNOSIS — L658 Other specified nonscarring hair loss: Secondary | ICD-10-CM

## 2024-05-20 DIAGNOSIS — Z6841 Body Mass Index (BMI) 40.0 and over, adult: Secondary | ICD-10-CM

## 2024-05-20 DIAGNOSIS — E66813 Obesity, class 3: Secondary | ICD-10-CM | POA: Diagnosis not present

## 2024-05-20 DIAGNOSIS — M62838 Other muscle spasm: Secondary | ICD-10-CM | POA: Diagnosis not present

## 2024-05-20 DIAGNOSIS — I10 Essential (primary) hypertension: Secondary | ICD-10-CM

## 2024-05-20 MED ORDER — TIZANIDINE HCL 4 MG PO TABS
4.0000 mg | ORAL_TABLET | Freq: Every day | ORAL | 0 refills | Status: DC
Start: 2024-05-20 — End: 2024-06-13

## 2024-05-20 MED ORDER — SEMAGLUTIDE-WEIGHT MANAGEMENT 0.5 MG/0.5ML ~~LOC~~ SOAJ
0.5000 mg | SUBCUTANEOUS | 0 refills | Status: AC
Start: 2024-06-18 — End: 2024-07-16

## 2024-05-20 MED ORDER — SEMAGLUTIDE-WEIGHT MANAGEMENT 0.25 MG/0.5ML ~~LOC~~ SOAJ
0.2500 mg | SUBCUTANEOUS | 0 refills | Status: DC
Start: 2024-05-20 — End: 2024-05-27

## 2024-05-20 NOTE — Progress Notes (Signed)
 Established patient visit   Patient: Rhonda Williamson   DOB: [birth year]   58 y.o. Female  MRN: [id number] Visit Date: 05/20/2024  Today's healthcare provider: Rockie Agent, MD   Chief Complaint  Patient presents with   Hypertension    Decline vaccines   Hair/Scalp Problem   Back Pain    R lower back x 1 month otc: tylenol  helps a little    Subjective     HPI     Hypertension    Additional comments: Decline vaccines        Back Pain    Additional comments: R lower back x 1 month otc: tylenol  helps a little       Last edited by Wilfred Hargis RAMAN, CMA on 05/20/2024  2:57 PM.       Discussed the use of AI scribe software for clinical note transcription with the patient, who gave verbal consent to proceed.  History of Present Illness Rhonda Williamson is a 58 year old female with chronic hypertension who presents for follow-up.  She is currently on a regimen of amlodipine  10 mg, benazepril  40 mg, and spironolactone  25 mg daily for her chronic hypertension. She has been actively working on dietary changes, such as reducing portion sizes and opting for lighter meals, to better manage her condition.  She has been experiencing acute back pain for the past month, described as a 'catch' in her back, particularly when getting up or turning over in bed. The pain is localized to the right side and is accompanied by a feeling of tightness. Tylenol  has provided minimal relief, and the pain, while not severe, is bothersome, especially during movement or when standing up.  She is using minoxidil  5% topical for hair loss and has incorporated a biotin-containing shampoo into her routine. Additionally, she takes biotin orally. She notes that her hair is thin but is observing some growth.  She works in an environment where she cannot afford to be sleepy.  Patient also wishes to start anti-obesity medication due to having trouble with achieving weight loss above 2-5 llbs with  dietary and exercise changes. She denies family hx of thyroid cancer. She is exercising by walking 3 days per week and reports consuming well balanced meals. She does not currently count calories.      Past Medical History:  Diagnosis Date   Hypertension    Sleep apnea     Medications: Outpatient Medications Prior to Visit  Medication Sig   acetaminophen  (TYLENOL ) 325 MG tablet Take 2 tablets (650 mg total) by mouth every 6 (six) hours as needed for mild pain or headache (fever >/= 101).   albuterol  (VENTOLIN  HFA) 108 (90 Base) MCG/ACT inhaler TAKE 2 PUFFS BY MOUTH EVERY 6 HOURS AS NEEDED FOR WHEEZE OR SHORTNESS OF BREATH   amLODipine -benazepril  (LOTREL) 10-40 MG capsule TAKE 1 CAPSULE BY MOUTH EVERY DAY   cetirizine  (ZYRTEC ) 10 MG tablet TAKE 1 TABLET BY MOUTH EVERY DAY. *NDC NOT COVD*   fluconazole  (DIFLUCAN ) 150 MG tablet Take one 150mg  tablet by mouth at onset of symptoms and repeat after 72 hours if symptoms continue   fluticasone  (FLONASE ) 50 MCG/ACT nasal spray Spray two sprays into each nostril daily   Minoxidil  5 % FOAM Apply 1 Application topically daily.   spironolactone  (ALDACTONE ) 25 MG tablet Take 1 tablet (25 mg total) by mouth daily. (Patient not taking: Reported on 05/20/2024)   No facility-administered medications prior to visit.    Review of  Systems    Chemistry      Component Value Date/Time   NA 142 03/25/2024 1528   K 3.9 03/25/2024 1528   CL 102 03/25/2024 1528   CO2 23 03/25/2024 1528   BUN 10 03/25/2024 1528   CREATININE 0.70 03/25/2024 1528      Component Value Date/Time   CALCIUM 9.3 03/25/2024 1528   ALKPHOS 79 03/25/2024 1528   AST 22 03/25/2024 1528   ALT 27 03/25/2024 1528   BILITOT 0.5 03/25/2024 1528       Last vitamin D  No results found for: 25OHVITD2, 25OHVITD3, VD25OH Last vitamin B12 and Folate No results found for: VITAMINB12, FOLATE      Objective    BP 134/85 (BP Location: Left Arm, Patient Position: Sitting,  Cuff Size: Large)   Pulse 84   Resp 16   Ht 5' 8 (1.727 m)   Wt 266 lb 8 oz (120.9 kg)   LMP 07/19/1995 (Approximate)   BMI 40.52 kg/m   BP Readings from Last 3 Encounters:  05/20/24 134/85  03/25/24 137/81  11/27/23 137/75   Wt Readings from Last 3 Encounters:  05/20/24 266 lb 8 oz (120.9 kg)  03/25/24 268 lb (121.6 kg)  11/27/23 266 lb (120.7 kg)        Physical Exam Vitals reviewed.  Constitutional:      General: She is not in acute distress.    Appearance: Normal appearance. She is not ill-appearing.  Cardiovascular:     Rate and Rhythm: Normal rate and regular rhythm.  Pulmonary:     Effort: Pulmonary effort is normal. No respiratory distress.     Breath sounds: No wheezing, rhonchi or rales.  Musculoskeletal:       Arms:  Neurological:     Mental Status: She is alert and oriented to person, place, and time.  Psychiatric:        Mood and Affect: Mood normal.        Behavior: Behavior normal.       No results found for any visits on 05/20/24.  Assessment & Plan     Problem List Items Addressed This Visit       Cardiovascular and Mediastinum   Essential (primary) hypertension - Primary   Chronic hypertension with current blood pressure of 134/85 mmHg, not at target but not critically high. Current regimen includes amlodipine , benazepril , and spironolactone , with spironolactone  replacing hydrochlorothiazide . - Continue amlodipine  10 mg daily - Continue benazepril  40 mg daily - start previously prescribed spironolactone  25 mg daily        Musculoskeletal and Integument   Female pattern hair loss   Female pattern hair loss Chronic  Improving  Managed with topical minoxidil  5%. She is using biotin shampoo and considering vitamin E supplementation. Discussed vitamin E use both orally and topically for hair health. - discontinue minoxidil  5% topical application - Consider vitamin E supplementation, either orally or topically        Other   Morbid  obesity (HCC)   Relevant Medications   semaglutide -weight management (WEGOVY ) 0.25 MG/0.5ML SOAJ SQ injection   semaglutide -weight management (WEGOVY ) 0.5 MG/0.5ML SOAJ SQ injection (Start on 06/18/2024)   Class 3 severe obesity with serious comorbidity and body mass index (BMI) of 40.0 to 44.9 in adult   Chronic  Obesity with BMI over 30. Discussed weight loss options including lifestyle modifications and pharmacotherapy. Semaglutide  considered due to BMI and hypertension comorbidity. Discussed potential side effects: nausea, heartburn, constipation, diarrhea. Insurance coverage is a consideration. -  Submit prior authorization for semaglutide  0.25 mg once weekly for 1 month  - Schedule follow-up in six weeks to assess response to semaglutide  if approved -encouraged 150-2106mins of exercise per week and minimum goal of 25g of protein per meal        Relevant Medications   semaglutide -weight management (WEGOVY ) 0.25 MG/0.5ML SOAJ SQ injection   semaglutide -weight management (WEGOVY ) 0.5 MG/0.5ML SOAJ SQ injection (Start on 06/18/2024)   Other Visit Diagnoses       Muscle spasm       Relevant Medications   tiZANidine  (ZANAFLEX ) 4 MG tablet       Assessment & Plan   Right-sided back pain Acute right-sided back pain persisting for one month, described as a catch in the right paraspinal muscles with associated tightness. Tylenol  minimally effective. Discussed muscle relaxants, specifically Zanaflex , to be taken at night due to sedative effects. - Prescribe Zanaflex  4mg  (tizanidine ) at the lowest dose, to be taken at night       Return in about 6 weeks (around 07/01/2024) for Weight MGMT.         Rockie Agent, MD  [facility] [phone] (phone) [phone] (fax)  [facility]

## 2024-05-21 ENCOUNTER — Other Ambulatory Visit (HOSPITAL_COMMUNITY): Payer: Self-pay

## 2024-05-22 DIAGNOSIS — L658 Other specified nonscarring hair loss: Secondary | ICD-10-CM | POA: Insufficient documentation

## 2024-05-22 NOTE — Assessment & Plan Note (Signed)
 Female pattern hair loss Chronic  Improving  Managed with topical minoxidil  5%. She is using biotin shampoo and considering vitamin E supplementation. Discussed vitamin E use both orally and topically for hair health. - discontinue minoxidil  5% topical application - Consider vitamin E supplementation, either orally or topically

## 2024-05-22 NOTE — Assessment & Plan Note (Addendum)
 Chronic  Obesity with BMI over 30. Discussed weight loss options including lifestyle modifications and pharmacotherapy. Semaglutide  considered due to BMI and hypertension comorbidity. Discussed potential side effects: nausea, heartburn, constipation, diarrhea. Insurance coverage is a consideration. - Submit prior authorization for semaglutide  0.25 mg once weekly for 1 month  - Schedule follow-up in six weeks to assess response to semaglutide  if approved -encouraged 150-266mins of exercise per week and minimum goal of 25g of protein per meal

## 2024-05-22 NOTE — Assessment & Plan Note (Signed)
 Chronic hypertension with current blood pressure of 134/85 mmHg, not at target but not critically high. Current regimen includes amlodipine , benazepril , and spironolactone , with spironolactone  replacing hydrochlorothiazide . - Continue amlodipine  10 mg daily - Continue benazepril  40 mg daily - start previously prescribed spironolactone  25 mg daily

## 2024-05-26 NOTE — Telephone Encounter (Signed)
 Copied from CRM #[id number]. Topic: Clinical - Medication Prior Auth >> May 26, 2024  1:45 PM Antwanette L wrote: Patient is calling back to get an update on an alternative medication for Wegovy . Patient is requesting a call back at [phone]

## 2024-05-26 NOTE — Telephone Encounter (Signed)
 Copied from CRM #[id number]. Topic: Clinical - Medication Prior Auth >> May 23, 2024  3:42 PM Deaijah H wrote: Reason for CRM: Patient called in stating pharmacy Did not fill due to insurance not covering but waiting on her provider to provide and alternative. Reached out to [facility] they stated they're waiting on PA team and could take up to 2wks notified patient.

## 2024-05-27 ENCOUNTER — Telehealth: Payer: Self-pay

## 2024-05-27 ENCOUNTER — Other Ambulatory Visit (HOSPITAL_COMMUNITY): Payer: Self-pay

## 2024-05-27 NOTE — Telephone Encounter (Signed)
 Pharmacy Patient Advocate Encounter   Received notification from Pt Calls Messages that prior authorization for Wegovy  0.25mg /0.42ml Pen is required/requested.   Insurance verification completed.   The patient is insured through [employer] .   Per test claim: Medication is not covered due to plan/benefit exclusion

## 2024-05-27 NOTE — Progress Notes (Signed)
 Unfortunately, medication is not covered by pt's plan. Per test claim, Product/Service Not Covered - Plan/Benefit Exclusion

## 2024-05-28 ENCOUNTER — Other Ambulatory Visit (HOSPITAL_COMMUNITY): Payer: Self-pay

## 2024-05-28 NOTE — Telephone Encounter (Signed)
 Unfortunately, a PA wouldn't help as medication is not covered due to plan/benefit exclusion, per [employer] part D

## 2024-05-29 NOTE — Telephone Encounter (Signed)
 Please advise patient to contact insurance and see if Zepbound or Saxenda would be covered, also ask for medication options covered for weight loss by her plan   If no options, please advise patient to see me for a visit to discuss further

## 2024-06-13 ENCOUNTER — Other Ambulatory Visit: Payer: Self-pay | Admitting: Family Medicine

## 2024-06-13 DIAGNOSIS — M62838 Other muscle spasm: Secondary | ICD-10-CM

## 2024-07-01 ENCOUNTER — Ambulatory Visit: Admitting: Family Medicine

## 2024-07-02 ENCOUNTER — Other Ambulatory Visit: Payer: Self-pay | Admitting: Family Medicine

## 2024-07-02 DIAGNOSIS — I10 Essential (primary) hypertension: Secondary | ICD-10-CM

## 2024-07-22 ENCOUNTER — Encounter: Payer: Self-pay | Admitting: Family Medicine

## 2024-07-22 ENCOUNTER — Ambulatory Visit: Admitting: Family Medicine

## 2024-07-22 VITALS — BP 147/80 | HR 92 | Temp 98.8°F | Ht 68.0 in | Wt 265.4 lb

## 2024-07-22 DIAGNOSIS — J209 Acute bronchitis, unspecified: Secondary | ICD-10-CM | POA: Diagnosis not present

## 2024-07-22 DIAGNOSIS — R051 Acute cough: Secondary | ICD-10-CM | POA: Diagnosis not present

## 2024-07-22 LAB — POCT RAPID STREP A (OFFICE): Rapid Strep A Screen: NEGATIVE

## 2024-07-22 LAB — POCT INFLUENZA A/B
Influenza A, POC: NEGATIVE
Influenza B, POC: NEGATIVE

## 2024-07-22 MED ORDER — METHYLPREDNISOLONE ACETATE 40 MG/ML IJ SUSP
40.0000 mg | Freq: Once | INTRAMUSCULAR | Status: AC
Start: 2024-07-22 — End: 2024-07-22
  Administered 2024-07-22: 40 mg via INTRAMUSCULAR

## 2024-07-22 MED ORDER — PREDNISONE 20 MG PO TABS
20.0000 mg | ORAL_TABLET | Freq: Every day | ORAL | 0 refills | Status: AC
Start: 2024-07-23 — End: 2024-07-27

## 2024-07-22 MED ORDER — PROMETHAZINE-DM 6.25-15 MG/5ML PO SYRP
5.0000 mL | ORAL_SOLUTION | Freq: Four times a day (QID) | ORAL | 0 refills | Status: AC | PRN
Start: 2024-07-22 — End: ?

## 2024-07-22 MED ORDER — METHYLPREDNISOLONE ACETATE 40 MG/ML IJ SUSP
40.0000 mg | Freq: Once | INTRAMUSCULAR | Status: DC
Start: 2024-07-22 — End: 2024-07-22

## 2024-07-22 NOTE — Progress Notes (Signed)
 ACUTE VISIT   Patient: Rhonda Williamson   DOB: 06-09-1966   58 y.o. Female  MRN: [id number]   PCP: Sharma Coyer, MD  Chief Complaint  Patient presents with   Sore Throat    Patient reports sore throat, congestion nasal and chest congestion. Started Friday. Denied fever, body aches but some chills. Green phlegm   Subjective    HPI HPI     Sore Throat    Additional comments: Patient reports sore throat, congestion nasal and chest congestion. Started Friday. Denied fever, body aches but some chills. Green phlegm      Last edited by Cherry Chiquita HERO, CMA on 07/22/2024  3:57 PM.       Discussed the use of AI scribe software for clinical note transcription with the patient, who gave verbal consent to proceed.  History of Present Illness Rhonda Williamson is a 58 year old female who presents with sore throat, chest congestion, and nasal congestion.  She has been experiencing sore throat, chest congestion, and nasal congestion for the past five days. No fever or body aches, but she has chills. Her cough is productive with green phlegm, which later turned brown. Symptoms were particularly severe on Saturday and Sunday, with Sunday being the worst day.  She has been taking over-the-counter medications including Thoraclu, Tylenol  Sinus, and Mucinex . She has also been consuming soup and engaging in self-care activities such as taking baths and exercising. She feels significantly better today compared to the weekend.  She believes her symptoms started with sinus issues, initially experiencing a headache followed by nasal congestion and post-nasal drip leading to chest congestion. Her sore throat is primarily due to coughing up phlegm.  Her symptoms have affected her sleep, as she has been coughing frequently at night. Drinking cold beverages exacerbates her symptoms, causing discomfort in her chest. She has been drinking orange juice and prefers hot drinks to soothe her  throat.     Medications: Outpatient Medications Prior to Visit  Medication Sig   acetaminophen  (TYLENOL ) 325 MG tablet Take 2 tablets (650 mg total) by mouth every 6 (six) hours as needed for mild pain or headache (fever >/= 101).   albuterol  (VENTOLIN  HFA) 108 (90 Base) MCG/ACT inhaler TAKE 2 PUFFS BY MOUTH EVERY 6 HOURS AS NEEDED FOR WHEEZE OR SHORTNESS OF BREATH   amLODipine -benazepril  (LOTREL) 10-40 MG capsule TAKE 1 CAPSULE BY MOUTH EVERY DAY   cetirizine  (ZYRTEC ) 10 MG tablet TAKE 1 TABLET BY MOUTH EVERY DAY. *NDC NOT COVD*   fluconazole  (DIFLUCAN ) 150 MG tablet Take one 150mg  tablet by mouth at onset of symptoms and repeat after 72 hours if symptoms continue   fluticasone  (FLONASE ) 50 MCG/ACT nasal spray Spray two sprays into each nostril daily   Minoxidil  5 % FOAM Apply 1 Application topically daily.   semaglutide -weight management (WEGOVY ) 0.25 MG/0.5ML SOAJ SQ injection INJECT 0.25 MG INTO THE SKIN ONCE A WEEK FOR 28 DAYS   spironolactone  (ALDACTONE ) 25 MG tablet Take 1 tablet (25 mg total) by mouth daily.   tiZANidine  (ZANAFLEX ) 4 MG tablet TAKE 1 TABLET BY MOUTH AT BEDTIME.   No facility-administered medications prior to visit.    {Insert previous labs (optional):23779} {See past labs  Heme  Chem  Endocrine  Serology  Results Review (optional):1}   Objective    BP (!) 147/80 (BP Location: Left Arm, Patient Position: Sitting, Cuff Size: Normal)   Pulse 92   Temp 98.8 F (37.1 C) (Oral)  Ht 5' 8 (1.727 m)   Wt 265 lb 6.4 oz (120.4 kg)   LMP 07/19/1995 (Approximate)   SpO2 98%   BMI 40.35 kg/m  {Insert last BP/Wt (optional):23777}{See vitals history (optional):1}  Physical Exam   Physical Exam HEENT: Raspy voice, left turbinates enlarged and boggy, no maxillary or frontal sinus tenderness, bilateral tympanic membranes not bulging. NECK: No cervical lymphadenopathy. CHEST: Wheezing in bilateral lower lung fields, no crackles appreciated. CARDIOVASCULAR:  Regular cardiac rhythm.   Results for orders placed or performed in visit on 07/22/24  POCT rapid strep A  Result Value Ref Range   Rapid Strep A Screen Negative Negative  POCT Influenza A/B  Result Value Ref Range   Influenza A, POC Negative Negative   Influenza B, POC Negative Negative    Assessment & Plan     Assessment and Plan Assessment & Plan Acute bronchitis with acute cough Acute bronchitis with productive cough and green sputum, persisting for five days. Symptoms include sore throat, chest congestion, and nasal congestion. Negative for influenza A and B, and rapid strep test. Examination reveals bilateral lower lung field wheezing, raspy voice, and enlarged, boggy left turbinates. No maxillary or frontal sinus tenderness. Bilateral tympanic membranes are not bulging. Symptoms have improved but remain significant, with a desire to avoid recurrence. - Administered 40 mg diphenhydramine  injection in office today. - Prescribed prednisone 40 mg once daily for four days. - Prescribed cough syrup. - Advised to call if symptoms worsen by Friday for potential change in treatment.      No follow-ups on file.        Rockie Agent, MD  [facility] [phone] (phone) [phone] (fax)  [facility]

## 2024-07-22 NOTE — Patient Instructions (Signed)
  VISIT SUMMARY: You visited today due to a sore throat, chest congestion, and nasal congestion that have been ongoing for five days. You have been taking over-the-counter medications and engaging in self-care, and you feel better today compared to the weekend.  YOUR PLAN: ACUTE BRONCHITIS WITH ACUTE COUGH: You have acute bronchitis with a productive cough and green sputum, which has been persisting for five days. Your symptoms include a sore throat, chest congestion, and nasal congestion. -You received a 40 mg diphenhydramine  injection in the office today. -You are prescribed prednisone 40 mg to take once daily for four days. -You are prescribed cough syrup to help manage your cough. -Please call us  if your symptoms worsen by Friday for a potential change in treatment.                                      Contains text generated by [employer].     To keep you healthy, please keep in mind the following health maintenance items that you are due for:   Health Maintenance Due  Topic Date Due   Hepatitis B Vaccines 19-59 Average Risk (1 of 3 - 19+ 3-dose series) Never done   Pneumococcal Vaccine: 50+ Years (2 of 2 - PCV) 11/08/2013   COVID-19 Vaccine (3 - Moderna risk series) 04/23/2020   Influenza Vaccine  05/02/2024     Best Wishes,   Dr. Lang

## 2024-07-24 ENCOUNTER — Ambulatory Visit: Admitting: Family Medicine

## 2024-07-26 ENCOUNTER — Ambulatory Visit: Payer: Self-pay | Admitting: Family Medicine

## 2024-08-13 ENCOUNTER — Ambulatory Visit: Admitting: Family Medicine

## 2024-10-03 ENCOUNTER — Ambulatory Visit: Payer: Self-pay

## 2024-10-03 NOTE — Telephone Encounter (Signed)
 Husband of pt encourages to take pt to an urgent care, he declined. They have an appt for Monday and he says they will keep that, but go to urgent care/call back if anything worsens.   FYI Only or Action Required?: FYI only for provider: appointment scheduled on Monday.  Patient was last seen in primary care on 07/22/2024 by Sharma Coyer, MD.  Called Nurse Triage reporting Otalgia.  Symptoms began yesterday.  Interventions attempted: OTC medications: tylenol , ibuprofen , sudafed.  Symptoms are: gradually worsening.  Triage Disposition: See Physician Within 24 Hours  Patient/caregiver understands and will follow disposition?: No, wishes to speak with PCP  Copied from CRM #[id number]. Topic: Clinical - Red Word Triage >> Oct 03, 2024  1:49 PM Roselie BROCKS wrote: Red Word that prompted transfer to Nurse Triage: Patients husband  Tien, [place], returning a call to NT concerning the patient is having pain in her right ear with drainage in throat. Wanting an antibiotic. Advised that the office does not have appointments today. Wanting to know what medication wife can take Reason for Disposition  Earache  (Exceptions: Brief ear pain of lasting less than 60 minutes, or earache occurring during air travel.)  Answer Assessment - Initial Assessment Questions 1. LOCATION: Which ear is involved?     R ear, radiates down her neck  2. ONSET: When did the ear pain start?      Yesterday  3. SEVERITY: How bad is the pain?  (Scale 1-10; mild, moderate or severe)     It's aggravating per husband of pt; pt taking sudafed and tylenol   4. URI SYMPTOMS: Do you have a runny nose or cough?     Cough, congestion, sore throat  5. FEVER: Do you have a fever? If Yes, ask: What is your temperature, how was it measured, and when did it start?     Denies  6. CAUSE:      Ear infection, husband of pt states this same thing occurred last year and it was an ear infection  7. OTHER  SYMPTOMS: Do you have any other symptoms? (e.g., decreased hearing, dizziness, headache, stiff neck, vomiting)     Denies  Protocols used: Earache-A-AH

## 2024-10-03 NOTE — Telephone Encounter (Signed)
 Copied from CRM [id number]. Topic: Clinical - Medical Advice >> Oct 03, 2024 11:05 AM Delon HERO wrote: Reason for CRM: Patient's husband is calling to report that the patient is having pain in her right ear with drainage in throat. Wanting an antibiotic. Advised that the office does not have appointments today. Wanting to know what medication wife can take.

## 2024-10-06 ENCOUNTER — Ambulatory Visit: Admitting: Family Medicine

## 2024-10-06 ENCOUNTER — Encounter: Payer: Self-pay | Admitting: Family Medicine

## 2024-10-06 ENCOUNTER — Ambulatory Visit: Admitting: Nurse Practitioner

## 2024-10-06 VITALS — BP 119/75 | HR 80

## 2024-10-06 DIAGNOSIS — J302 Other seasonal allergic rhinitis: Secondary | ICD-10-CM

## 2024-10-06 DIAGNOSIS — H6501 Acute serous otitis media, right ear: Secondary | ICD-10-CM

## 2024-10-06 DIAGNOSIS — R0989 Other specified symptoms and signs involving the circulatory and respiratory systems: Secondary | ICD-10-CM

## 2024-10-06 LAB — POC COVID19 BINAXNOW: SARS Coronavirus 2 Ag: NEGATIVE

## 2024-10-06 MED ORDER — FLUTICASONE PROPIONATE 50 MCG/ACT NA SUSP: NASAL | 3 refills | Status: AC

## 2024-10-06 MED ORDER — AMOXICILLIN 875 MG PO TABS: 875.0000 mg | ORAL_TABLET | Freq: Two times a day (BID) | ORAL | 0 refills | Status: AC

## 2024-10-06 NOTE — Patient Instructions (Addendum)
 To keep you healthy, please keep in mind the following health maintenance items that you are due for:   Health Maintenance Due  Topic Date Due   Hepatitis B Vaccines 19-59 Average Risk (1 of 3 - 19+ 3-dose series) Never done   Pneumococcal Vaccine: 50+ Years (2 of 2 - PCV) 11/08/2013   COVID-19 Vaccine (3 - Moderna risk series) 04/23/2020   If needing additional care and no availability in office please visit [email or web address] where you can connect with healthcare providers that you know and trust from the comfort of your home using with same day virtual care appointments. With the options of Virtual Urgent Care and Mychart E-Visits from 8:00 am to 8:00 pm. See website for further details.   Best Wishes,   Dr. Lang

## 2024-10-06 NOTE — Progress Notes (Signed)
 "  Acute Office Visit  Patient ID: Rhonda Williamson, female    DOB: December 16, 1965, 59 y.o.   MRN: [id number]   PCP: Sharma Coyer, MD  Chief Complaint  Patient presents with   Acute Visit    Patient is present due to possible ear infection // Rn Triage on 10/03/24 // Patient husband reported R ear pain radiating down patient neck associated with postnasal drip, headache, facial pressure.  Onset 10/02/24. Taking alka seltzer from pharmacy, sudafed and tylenol .  No sick contacts.   Ear Pain    Subjective:     HPI  Discussed the use of AI scribe software for clinical note transcription with the patient, who gave verbal consent to proceed.  History of Present Illness Rhonda Williamson is a 59 year old female who presents with acute right otalgia.  She has been experiencing right ear pain for the past week, radiating down her neck, accompanied by a headache, facial pressure, and postnasal drip. She describes a sensation of coolness from her nose to her ear and notes right nostril congestion.  She has been using Alka-Seltzer, Sudafed, and Tylenol  for symptom relief. Certain smells, such as burning candles, exacerbate her headache, creating a sensation of her head 'closing in'.  She recalls an episode of dizziness and visual disturbance the day before her ear symptoms began, which occurred while at a drive-thru. Her vision was affected when looking at a yellow screen, resolving after about 90 seconds.  No congestion, sneezing, or coughing. Her ears feel wet, especially when swallowing, but she does not swim or engage in activities that would typically cause ear moisture.  She uses a CPAP machine and has braces, which she speculates might affect her sinuses. She recalls a similar episode of ear pain treated previously, possibly in late 2024, but does not consider it a frequent recurrence.   ROS     Objective:    BP 119/75 (BP Location: Right Arm, Patient Position: Sitting, Cuff  Size: Large)   Pulse 80   LMP 07/19/1995   SpO2 98%    Physical Exam HENT:     Head:     Comments: Frontal sinus tenderness bilateraaly, bilateral maxillary tenderness    Right Ear: Tenderness present. A middle ear effusion is present. Tympanic membrane is erythematous and bulging. Tympanic membrane is not perforated.     Left Ear: A middle ear effusion is present. Tympanic membrane is erythematous. Tympanic membrane is not perforated or bulging.     Mouth/Throat:     Mouth: Mucous membranes are moist.     Tongue: No lesions.     Palate: No mass and lesions.     Pharynx: Oropharynx is clear.       Results for orders placed or performed in visit on 10/06/24  POC COVID-19  Result Value Ref Range   SARS Coronavirus 2 Ag Negative Negative       Assessment & Plan:   Problem List Items Addressed This Visit   None Visit Diagnoses       Non-recurrent acute serous otitis media of right ear    -  Primary   Relevant Medications   amoxicillin  (AMOXIL ) 875 MG tablet     Seasonal allergic rhinitis, unspecified trigger       Relevant Medications   fluticasone  (FLONASE ) 50 MCG/ACT nasal spray     Upper respiratory symptom       Relevant Orders   POC COVID-19 (Completed)       Assessment and  Plan Assessment & Plan Acute otitis media Bilateral erythematous tympanic membranes with right ear pain radiating down the neck, associated with postnasal drip, headache, and facial pressure. Symptoms have persisted since last week. Previous similar episodes treated successfully with antibiotics. Negative POC COVID test in office today  - Prescribed amoxicillin  875 mg twice daily for 7 days.  Acute sinusitis Headache, facial pressure, and sinus congestion exacerbated by certain smells. Symptoms suggest sinusitis related to upper respiratory infection. No significant congestion or cough reported. - Prescribed amoxicillin  875 mg twice daily for 7 days. - Refilled Flonase  for nasal  congestion.  Seasonal allergic rhinitis Nasal congestion and postnasal drip. Symptoms exacerbated by environmental triggers such as smoke and candles. - Refilled Flonase  for nasal congestion.    Meds ordered this encounter  Medications   fluticasone  (FLONASE ) 50 MCG/ACT nasal spray    Sig: Spray two sprays into each nostril daily    Dispense:  48 mL    Refill:  3   amoxicillin  (AMOXIL ) 875 MG tablet    Sig: Take 1 tablet (875 mg total) by mouth 2 (two) times daily for 7 days.    Dispense:  14 tablet    Refill:  0    Return in about 2 months (around 12/04/2024) for Chronic F/U, HTN, BG.  Rockie Agent, MD [facility]   "

## 2024-12-23 ENCOUNTER — Ambulatory Visit: Admitting: Family Medicine
